# Patient Record
Sex: Male | Born: 1995
Health system: Southern US, Community
[De-identification: ages and names within clinical notes are randomized; demographics above are authoritative.]

## PROBLEM LIST (undated history)

## (undated) DIAGNOSIS — F329 Major depressive disorder, single episode, unspecified: Secondary | ICD-10-CM

## (undated) DIAGNOSIS — F419 Anxiety disorder, unspecified: Secondary | ICD-10-CM

## (undated) DIAGNOSIS — F32A Depression, unspecified: Secondary | ICD-10-CM

## (undated) HISTORY — DX: Anxiety disorder, unspecified: F41.9

## (undated) HISTORY — PX: APPENDECTOMY: SHX54

## (undated) HISTORY — PX: MYRINGOTOMY WITH TUBE PLACEMENT: SHX5663

## (undated) HISTORY — PX: HERNIA REPAIR: SHX51

---
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
## 2016-03-01 DIAGNOSIS — R1013 Epigastric pain: Secondary | ICD-10-CM | POA: Diagnosis not present

## 2016-03-01 DIAGNOSIS — R112 Nausea with vomiting, unspecified: Secondary | ICD-10-CM | POA: Diagnosis not present

## 2016-03-01 DIAGNOSIS — R1031 Right lower quadrant pain: Secondary | ICD-10-CM | POA: Diagnosis not present

## 2016-03-01 DIAGNOSIS — K353 Acute appendicitis with localized peritonitis: Secondary | ICD-10-CM | POA: Diagnosis not present

## 2016-03-01 DIAGNOSIS — R072 Precordial pain: Secondary | ICD-10-CM | POA: Diagnosis not present

## 2016-03-01 DIAGNOSIS — K3580 Unspecified acute appendicitis: Secondary | ICD-10-CM | POA: Diagnosis not present

## 2016-03-01 DIAGNOSIS — I4510 Unspecified right bundle-branch block: Secondary | ICD-10-CM | POA: Diagnosis not present

## 2016-03-02 DIAGNOSIS — K3580 Unspecified acute appendicitis: Secondary | ICD-10-CM | POA: Diagnosis not present

## 2016-03-02 DIAGNOSIS — K37 Unspecified appendicitis: Secondary | ICD-10-CM | POA: Diagnosis not present

## 2016-03-02 DIAGNOSIS — K3589 Other acute appendicitis: Secondary | ICD-10-CM | POA: Diagnosis not present

## 2016-03-02 DIAGNOSIS — K353 Acute appendicitis with localized peritonitis: Secondary | ICD-10-CM | POA: Diagnosis not present

## 2017-08-13 DIAGNOSIS — F322 Major depressive disorder, single episode, severe without psychotic features: Secondary | ICD-10-CM | POA: Diagnosis not present

## 2017-08-13 DIAGNOSIS — F1290 Cannabis use, unspecified, uncomplicated: Secondary | ICD-10-CM | POA: Diagnosis not present

## 2017-08-13 DIAGNOSIS — F411 Generalized anxiety disorder: Secondary | ICD-10-CM | POA: Diagnosis not present

## 2017-09-15 DIAGNOSIS — F322 Major depressive disorder, single episode, severe without psychotic features: Secondary | ICD-10-CM | POA: Diagnosis not present

## 2017-10-13 DIAGNOSIS — F322 Major depressive disorder, single episode, severe without psychotic features: Secondary | ICD-10-CM | POA: Diagnosis not present

## 2017-10-20 DIAGNOSIS — F319 Bipolar disorder, unspecified: Secondary | ICD-10-CM | POA: Diagnosis not present

## 2017-10-29 DIAGNOSIS — F319 Bipolar disorder, unspecified: Secondary | ICD-10-CM | POA: Diagnosis not present

## 2017-11-04 DIAGNOSIS — F319 Bipolar disorder, unspecified: Secondary | ICD-10-CM | POA: Diagnosis not present

## 2017-11-11 DIAGNOSIS — F319 Bipolar disorder, unspecified: Secondary | ICD-10-CM | POA: Diagnosis not present

## 2017-11-17 DIAGNOSIS — F319 Bipolar disorder, unspecified: Secondary | ICD-10-CM | POA: Diagnosis not present

## 2017-11-24 DIAGNOSIS — F319 Bipolar disorder, unspecified: Secondary | ICD-10-CM | POA: Diagnosis not present

## 2017-11-25 DIAGNOSIS — F319 Bipolar disorder, unspecified: Secondary | ICD-10-CM | POA: Diagnosis not present

## 2017-12-08 DIAGNOSIS — F319 Bipolar disorder, unspecified: Secondary | ICD-10-CM | POA: Diagnosis not present

## 2017-12-29 DIAGNOSIS — Z79899 Other long term (current) drug therapy: Secondary | ICD-10-CM | POA: Diagnosis not present

## 2017-12-29 DIAGNOSIS — F319 Bipolar disorder, unspecified: Secondary | ICD-10-CM | POA: Diagnosis not present

## 2018-02-04 DIAGNOSIS — F319 Bipolar disorder, unspecified: Secondary | ICD-10-CM | POA: Diagnosis not present

## 2018-02-21 DIAGNOSIS — F319 Bipolar disorder, unspecified: Secondary | ICD-10-CM | POA: Diagnosis not present

## 2018-03-05 DIAGNOSIS — F909 Attention-deficit hyperactivity disorder, unspecified type: Secondary | ICD-10-CM | POA: Diagnosis not present

## 2018-03-05 DIAGNOSIS — F329 Major depressive disorder, single episode, unspecified: Secondary | ICD-10-CM | POA: Diagnosis not present

## 2018-03-05 DIAGNOSIS — R110 Nausea: Secondary | ICD-10-CM | POA: Diagnosis not present

## 2018-03-05 DIAGNOSIS — R42 Dizziness and giddiness: Secondary | ICD-10-CM | POA: Diagnosis not present

## 2018-03-05 DIAGNOSIS — Z791 Long term (current) use of non-steroidal anti-inflammatories (NSAID): Secondary | ICD-10-CM | POA: Diagnosis not present

## 2018-03-05 DIAGNOSIS — Z79891 Long term (current) use of opiate analgesic: Secondary | ICD-10-CM | POA: Diagnosis not present

## 2018-03-05 DIAGNOSIS — T56891A Toxic effect of other metals, accidental (unintentional), initial encounter: Secondary | ICD-10-CM | POA: Diagnosis not present

## 2018-03-05 DIAGNOSIS — Z79899 Other long term (current) drug therapy: Secondary | ICD-10-CM | POA: Diagnosis not present

## 2018-03-20 ENCOUNTER — Encounter (HOSPITAL_COMMUNITY): Payer: Self-pay | Admitting: *Deleted

## 2018-03-20 ENCOUNTER — Inpatient Hospital Stay (HOSPITAL_COMMUNITY)
Admission: RE | Admit: 2018-03-20 | Discharge: 2018-03-24 | DRG: 885 | Disposition: A | Discharge status: Home / Self Care | Payer: BLUE CROSS/BLUE SHIELD | Attending: Psychiatry | Admitting: Psychiatry

## 2018-03-20 ENCOUNTER — Other Ambulatory Visit: Payer: Self-pay

## 2018-03-20 DIAGNOSIS — Z915 Personal history of self-harm: Secondary | ICD-10-CM | POA: Diagnosis not present

## 2018-03-20 DIAGNOSIS — Z8249 Family history of ischemic heart disease and other diseases of the circulatory system: Secondary | ICD-10-CM | POA: Diagnosis not present

## 2018-03-20 DIAGNOSIS — E785 Hyperlipidemia, unspecified: Secondary | ICD-10-CM | POA: Diagnosis not present

## 2018-03-20 DIAGNOSIS — R45851 Suicidal ideations: Secondary | ICD-10-CM | POA: Diagnosis present

## 2018-03-20 DIAGNOSIS — F3130 Bipolar disorder, current episode depressed, mild or moderate severity, unspecified: Principal | ICD-10-CM | POA: Diagnosis present

## 2018-03-20 DIAGNOSIS — G4700 Insomnia, unspecified: Secondary | ICD-10-CM | POA: Diagnosis not present

## 2018-03-20 DIAGNOSIS — Z818 Family history of other mental and behavioral disorders: Secondary | ICD-10-CM

## 2018-03-20 HISTORY — DX: Major depressive disorder, single episode, unspecified: F32.9

## 2018-03-20 HISTORY — DX: Depression, unspecified: F32.A

## 2018-03-20 MED ORDER — ACETAMINOPHEN 325 MG PO TABS: 650.0000 mg | ORAL_TABLET | Freq: Four times a day (QID) | ORAL | Status: DC | PRN

## 2018-03-20 MED ORDER — HYDROXYZINE HCL 25 MG PO TABS
25.0000 mg | ORAL_TABLET | Freq: Three times a day (TID) | ORAL | Status: DC | PRN
  Administered 2018-03-20: 25 mg via ORAL
  Filled 2018-03-20 (×2): qty 1

## 2018-03-20 MED ORDER — MAGNESIUM HYDROXIDE 400 MG/5ML PO SUSP: 30.0000 mL | Freq: Every day | ORAL | Status: DC | PRN

## 2018-03-20 MED ORDER — ALUM & MAG HYDROXIDE-SIMETH 200-200-20 MG/5ML PO SUSP: 30.0000 mL | ORAL | Status: DC | PRN

## 2018-03-20 MED ORDER — QUETIAPINE FUMARATE 50 MG PO TABS
50.0000 mg | ORAL_TABLET | Freq: Every day | ORAL | Status: DC
  Administered 2018-03-20: 50 mg via ORAL
  Filled 2018-03-20 (×3): qty 1

## 2018-03-20 MED ORDER — TRAZODONE HCL 50 MG PO TABS
50.0000 mg | ORAL_TABLET | Freq: Every evening | ORAL | Status: DC | PRN
  Administered 2018-03-21 – 2018-03-23 (×3): 50 mg via ORAL
  Filled 2018-03-20 (×3): qty 1

## 2018-03-20 NOTE — H&P (Signed)
Behavioral Health Medical Screening Exam  Ronald Stanley is an 22 y.o. male.  Total Time spent with patient: 20 minutes  Psychiatric Specialty Exam: Physical Exam  Nursing note and vitals reviewed. Constitutional: He is oriented to person, place, and time. He appears well-developed and well-nourished.  Cardiovascular: Normal rate.  Respiratory: Effort normal.  Musculoskeletal: Normal range of motion.  Neurological: He is alert and oriented to person, place, and time.  Skin: Skin is warm.    Review of Systems  Constitutional: Negative.   HENT: Negative.   Eyes: Negative.   Respiratory: Negative.   Cardiovascular: Negative.   Gastrointestinal: Negative.   Genitourinary: Negative.   Musculoskeletal: Negative.   Skin: Negative.   Neurological: Negative.   Endo/Heme/Allergies: Negative.   Psychiatric/Behavioral: Positive for depression, substance abuse and suicidal ideas. The patient is nervous/anxious and has insomnia.     Blood pressure 119/83, pulse 76, temperature 98.4 F (36.9 C), SpO2 100 %.There is no height or weight on file to calculate BMI.  General Appearance: Casual  Eye Contact:  Good  Speech:  Clear and Coherent and Normal Rate  Volume:  Normal  Mood:  Depressed  Affect:  Depressed  Thought Process:  Linear and Descriptions of Associations: Intact  Orientation:  Full (Time, Place, and Person)  Thought Content:  WDL  Suicidal Thoughts:  Yes.  with intent/plan  Homicidal Thoughts:  No  Memory:  Immediate;   Good Recent;   Good Remote;   Good  Judgement:  Fair  Insight:  Fair  Psychomotor Activity:  Normal  Concentration: Concentration: Good and Attention Span: Good  Recall:  Good  Fund of Knowledge:Good  Language: Good  Akathisia:  No  Handed:  Right  AIMS (if indicated):     Assets:  Communication Skills Desire for Improvement Financial Resources/Insurance Housing Physical Health Social Support Transportation  Sleep:        Musculoskeletal: Strength & Muscle Tone: within normal limits Gait & Station: normal Patient leans: N/A  Blood pressure 119/83, pulse 76, temperature 98.4 F (36.9 C), SpO2 100 %.  Recommendations:  Based on my evaluation the patient does not appear to have an emergency medical condition.  [name], FNP 03/20/2018, 6:22 PM

## 2018-03-20 NOTE — Progress Notes (Signed)
Patient was seen by me today and wanted to note that patient reported home meds of being Seroquel XR 800 mg nightly, Risperdal 1 mg p.o. nightly, lithium 600 mg p.o. nightly and Lexapro 10 mg nightly.  Patient and patient's parents reported that patient stopped his medications several days ago.  Patient did agree to restart the Seroquel at 50 mg p.o. nightly tonight to assist with sleep so that psychiatry can reevaluate his medications tomorrow.  Patient reported that he had reported to an emergency department due to suspected lithium toxicity and he was taken 900 mg nightly and they decreased his lithium to 600 mg and since then he has not felt well and has caused some worsening of symptoms.  Patient now states that after the way he felt on the lithium that he would prefer not to be on it at all anymore.

## 2018-03-20 NOTE — Tx Team (Signed)
Initial Treatment Plan 03/20/2018 7:23 PM Arlice Colt HAF:[id number]    PATIENT STRESSORS: Medication change or noncompliance   PATIENT STRENGTHS: Ability for insight Average or above average intelligence Capable of independent living General fund of knowledge Motivation for treatment/growth Physical Health Supportive family/friends   PATIENT IDENTIFIED PROBLEMS: "work on my coping skills for SI"  "have a more positive outlook on life"  Suicide Risk  Ineffective Coping               DISCHARGE CRITERIA:  Ability to meet basic life and health needs Adequate post-discharge living arrangements Improved stabilization in mood, thinking, and/or behavior Medical problems require only outpatient monitoring Motivation to continue treatment in a less acute level of care Need for constant or close observation no longer present Reduction of life-threatening or endangering symptoms to within safe limits Safe-care adequate arrangements made Verbal commitment to aftercare and medication compliance  PRELIMINARY DISCHARGE PLAN: Outpatient therapy  PATIENT/FAMILY INVOLVEMENT: This treatment plan has been presented to and reviewed with the patient, Deke Rench.  The patient and family have been given the opportunity to ask questions and make suggestions.  Ferrel Logan, RN 03/20/2018, 7:23 PM

## 2018-03-20 NOTE — BH Assessment (Signed)
Assessment Note  Ronald Stanley is an 22 y.o. male that presented to [facility] as a walkin. Pts parents brought him in after pt came to their house crying and reporting that he was having thoughts of hurting himself. Pts parents reported that they were worried about the pt and were not sure that they could keep him safe. Pts parents reported that two weeks ago pt was admitted to the hospital due to Lithium Toxicity, and once discharged he was told to only take 600 mg of Lithium instead of 900 mg. Pts parents reported that due to reaction to the medication the patient stopped taking all medication. They reported that pt was not sleeping, not going to work, not taking a bath, and not eating.   This Clinical research associate then assessed patient, he reported that he was diagnosed with Bipolar 6 months ago, and that in the beginning his medication was working but now he does not feel like it was helping. Pt reported that this last week as been bad for him and that his racing thoughts got worse. Pt reported that he has only been sleeping one hour a night, and that he started smoking THC to get some sleep. Pt reported that he would take a couple pulls a week of a blunt, but it was not helping. Pt reported that he was having SI, and could not contract for safety. Pt also reported that he had been cutting, cuts noted to right forearm. Pt reported that he would like inpt treatment as he could not live like this anymore.   Pt was seen by Reola Calkins NP, inpt treatment recommended.    Diagnosis: Severe Bipolar Disorder, recent episode depressed F31.4  Past Medical History:  Past Medical History:  Diagnosis Date  . Depression     Past Surgical History:  Procedure Laterality Date  . HERNIA REPAIR    . MYRINGOTOMY WITH TUBE PLACEMENT      Family History:  Family History  Problem Relation Age of Onset  . Hyperlipidemia Father   . Hypertension Father   . Depression Mother     Social History:  reports that he has never smoked.  He has never used smokeless tobacco. He reports that he has current or past drug history. Drug: Marijuana. Frequency: 1.00 time per week. He reports that he does not drink alcohol.  Additional Social History:  Alcohol / Drug Use Pain Medications: none Prescriptions: none Over the Counter: none History of alcohol / drug use?: Yes  CIWA: CIWA-Ar BP: 119/83 Pulse Rate: 76 COWS:    Allergies: No Known Allergies  Home Medications:  No medications prior to admission.    OB/GYN Status:  No LMP for male patient.  General Assessment Data Location of Assessment: [facility] Is this an Initial Assessment or a Re-assessment for this encounter?: Initial Assessment Patient Accompanied by:: Parent(mother and father) Language Other than English: No Living Arrangements: Other (Comment)(has 2 roommates) What gender do you identify as?: Male Marital status: Single Pregnancy Status: No Living Arrangements: Non-relatives/Friends Can pt return to current living arrangement?: Yes Admission Status: Voluntary Is patient capable of signing voluntary admission?: Yes Referral Source: Self/Family/Friend Insurance type: BCBC  Medical Screening Exam [facility] Walk-in ONLY) Medical Exam completed: Yes  Crisis Care Plan Living Arrangements: Non-relatives/Friends Legal Guardian: (none) Name of Psychiatrist: Garnette Gunner Name of Therapist: None  Education Status Is patient currently in school?: No Is the patient employed, unemployed or receiving disability?: Employed  Risk to self with the past 6 months Suicidal  Ideation: Yes-Currently Present Has patient been a risk to self within the past 6 months prior to admission? : Yes Suicidal Intent: Yes-Currently Present Has patient had any suicidal intent within the past 6 months prior to admission? : Yes Is patient at risk for suicide?: Yes Suicidal Plan?: Yes-Currently Present Has patient had any suicidal plan within the past 6 months  prior to admission? : Yes Specify Current Suicidal Plan: cut self Access to Means: Yes Specify Access to Suicidal Means: cut self What has been your use of drugs/alcohol within the last 12 months?: THC Previous Attempts/Gestures: Yes How many times?: 1 Other Self Harm Risks: depression  Triggers for Past Attempts: Unpredictable Intentional Self Injurious Behavior: Cutting Comment - Self Injurious Behavior: cuts to right arm Family Suicide History: No Recent stressful life event(s): Recent negative physical changes(unable to sleep, racing thoughts) Persecutory voices/beliefs?: No Depression: Yes Depression Symptoms: Insomnia, Tearfulness, Isolating, Fatigue, Loss of interest in usual pleasures, Feeling worthless/self pity Substance abuse history and/or treatment for substance abuse?: No Suicide prevention information given to non-admitted patients: Not applicable  Risk to Others within the past 6 months Homicidal Ideation: No Does patient have any lifetime risk of violence toward others beyond the six months prior to admission? : No Thoughts of Harm to Others: No Current Homicidal Intent: No Current Homicidal Plan: No Access to Homicidal Means: No History of harm to others?: No Assessment of Violence: None Noted Violent Behavior Description: none Does patient have access to weapons?: No Criminal Charges Pending?: No Does patient have a court date: No Is patient on probation?: No  Psychosis Hallucinations: None noted Delusions: None noted  Mental Status Report Appearance/Hygiene: Unremarkable Eye Contact: Good Motor Activity: Restlessness Speech: Logical/coherent Level of Consciousness: Alert Mood: Depressed Affect: Depressed Anxiety Level: Moderate Thought Processes: Coherent Judgement: Impaired Orientation: Person, Place, Time, Situation Obsessive Compulsive Thoughts/Behaviors: None  Cognitive Functioning Concentration: Decreased Memory: Recent Intact, Remote  Intact Is patient IDD: No Insight: Fair Impulse Control: Poor Appetite: Poor Have you had any weight changes? : Loss Amount of the weight change? (lbs): 15 lbs Sleep: Decreased Total Hours of Sleep: 1 Vegetative Symptoms: Staying in bed, Not bathing, Decreased grooming  ADLScreening [facility] Assessment Services) Patient's cognitive ability adequate to safely complete daily activities?: Yes Patient able to express need for assistance with ADLs?: Yes Independently performs ADLs?: Yes (appropriate for developmental age)  Prior Inpatient Therapy Prior Inpatient Therapy: No  Prior Outpatient Therapy Prior Outpatient Therapy: Yes Prior Therapy Dates: 2019 Prior Therapy Facilty/Provider(s): Garnette Gunner Reason for Treatment: Bipolar Does patient have an ACCT team?: No Does patient have Intensive In-House Services?  : No Does patient have [facility] services? : No Does patient have P4CC services?: No  ADL Screening (condition at time of admission) Patient's cognitive ability adequate to safely complete daily activities?: Yes Is the patient deaf or have difficulty hearing?: No Does the patient have difficulty seeing, even when wearing glasses/contacts?: No Does the patient have difficulty concentrating, remembering, or making decisions?: No Patient able to express need for assistance with ADLs?: Yes Does the patient have difficulty dressing or bathing?: No Independently performs ADLs?: Yes (appropriate for developmental age) Does the patient have difficulty walking or climbing stairs?: No Weakness of Legs: None Weakness of Arms/Hands: None  Home Assistive Devices/Equipment Home Assistive Devices/Equipment: None  Therapy Consults (therapy consults require a physician order) PT Evaluation Needed: No OT Evalulation Needed: No SLP Evaluation Needed: No Abuse/Neglect Assessment (Assessment to be complete while patient is alone) Abuse/Neglect Assessment Can  Be Completed: Yes Physical  Abuse: Denies Verbal Abuse: Denies Sexual Abuse: Denies Exploitation of patient/patient's resources: Denies Self-Neglect: Denies Values / Beliefs Cultural Requests During Hospitalization: None Spiritual Requests During Hospitalization: None Consults Spiritual Care Consult Needed: No Social Work Consult Needed: No Merchant navy officer (For Healthcare) Does Patient Have a Medical Advance Directive?: No Would patient like information on creating a medical advance directive?: No - Patient declined Nutrition Screen- [facility] Adult/WL/AP Has the patient recently lost weight without trying?: No Has the patient been eating poorly because of a decreased appetite?: No Malnutrition Screening Tool Score: 0        Disposition: Pt seen by Reola Calkins NP, recommendation was inpt treatment. Pt was assigned [phone]. Disposition Initial Assessment Completed for this Encounter: Yes Disposition of Patient: Admit Type of inpatient treatment program: Adult Patient refused recommended treatment: No Mode of transportation if patient is discharged?: N/A  On Site Evaluation by: Reola Calkins NP  Reviewed with Physician:    Buford Dresser 03/20/2018 6:47 PM

## 2018-03-20 NOTE — Progress Notes (Signed)
Writer spoke with patient 1:1 and he reports that he has been having issues sleeping. Writer informed him of medications available. He was encouraged to attend wrap up group and other groups during the day. He was also encouraged to be up in the dayroom as much as possible instead of isolating in his room. He did later go to the dayroom and watched tv. He requested medication to aid with sleep which he received. Support given and safety maintained on unit with 15 min checks.

## 2018-03-20 NOTE — Progress Notes (Signed)
Patient ID: Ronald Stanley, male   DOB: 02/18/1996, 22 y.o.   MRN: [id number]  Admission Note  D) Patient admitted to the adult unit 400 hall. Patient is a 22 year old male who presents voluntarily to [facility] as a walk-in with his parents. Patient was calm and cooperative with the admission process. Patient reports he does not feel his medications are effective and has been experiencing thoughts of SI with no plan. Patient reports poor appetite, irritability and says he is only sleeping 4 hours a night. Patient with history of bipolar disorder and lithium toxicity. Patient reports he lives in [place] with friends and is not in school this semester. Patient is employed at [employer] While here, patient wants to work on "coping with my SI" and "having a more positive outlook on life".  A) Skin assessment was completed and unremarkable except for SF cuts to his L upper forearm and a tattoo to his R forearm. Patient has no belongings on admission. Plan of care, unit policies and patient expectations were explained. Patient receptive to information given with no questions. Patient verbalized understanding and contracted for safety on the unit. Written consents obtained. Vital signs obtained and WNL. Meal tray provided. Patient oriented to the unit and their room. Patient placed on standard q15 safety checks. Low fall risk precautions initiated and reviewed with patient; patient verbalized understanding.   R) Patient is in no acute distress. Patient remains safe on the unit at this time. Patient without questions or concerns at this time. Patient is visiting with his parents on the unit. Will continue to monitor.

## 2018-03-21 DIAGNOSIS — Z915 Personal history of self-harm: Secondary | ICD-10-CM

## 2018-03-21 DIAGNOSIS — F3130 Bipolar disorder, current episode depressed, mild or moderate severity, unspecified: Secondary | ICD-10-CM | POA: Diagnosis present

## 2018-03-21 LAB — RAPID URINE DRUG SCREEN, HOSP PERFORMED
Amphetamines: NOT DETECTED
Barbiturates: NOT DETECTED
Benzodiazepines: NOT DETECTED
Cocaine: NOT DETECTED
Opiates: NOT DETECTED
TETRAHYDROCANNABINOL: POSITIVE — AB

## 2018-03-21 LAB — HEMOGLOBIN A1C
Hgb A1c MFr Bld: 4.7 % — ABNORMAL LOW (ref 4.8–5.6)
Mean Plasma Glucose: 88.19 mg/dL

## 2018-03-21 LAB — COMPREHENSIVE METABOLIC PANEL
ALBUMIN: 4.6 g/dL (ref 3.5–5.0)
ALK PHOS: 59 U/L (ref 38–126)
ALT: 14 U/L (ref 0–44)
ANION GAP: 8 (ref 5–15)
AST: 14 U/L — ABNORMAL LOW (ref 15–41)
BUN: 10 mg/dL (ref 6–20)
CO2: 30 mmol/L (ref 22–32)
Calcium: 9.8 mg/dL (ref 8.9–10.3)
Chloride: 106 mmol/L (ref 98–111)
Creatinine, Ser: 1.05 mg/dL (ref 0.61–1.24)
GFR calc Af Amer: >60 mL/min (ref >60)
GFR calc non Af Amer: >60 mL/min (ref >60)
GLUCOSE: 83 mg/dL (ref 70–99)
POTASSIUM: 4.1 mmol/L (ref 3.5–5.1)
SODIUM: 144 mmol/L (ref 135–145)
Total Bilirubin: 0.9 mg/dL (ref 0.3–1.2)
Total Protein: 7.4 g/dL (ref 6.5–8.1)

## 2018-03-21 LAB — LITHIUM LEVEL

## 2018-03-21 LAB — LIPID PANEL
CHOL/HDL RATIO: 3.4 ratio
CHOLESTEROL: 146 mg/dL (ref 0–200)
HDL: 43 mg/dL (ref >40)
LDL CALC: 91 mg/dL (ref 0–99)
Triglycerides: 60 mg/dL (ref <150)
VLDL: 12 mg/dL (ref 0–40)

## 2018-03-21 LAB — CBC
HEMATOCRIT: 47.6 % (ref 39.0–52.0)
Hemoglobin: 16.3 g/dL (ref 13.0–17.0)
MCH: 29.3 pg (ref 26.0–34.0)
MCHC: 34.2 g/dL (ref 30.0–36.0)
MCV: 85.5 fL (ref 78.0–100.0)
Platelets: 271 10*3/uL (ref 150–400)
RBC: 5.57 MIL/uL (ref 4.22–5.81)
RDW: 12.3 % (ref 11.5–15.5)
WBC: 5.6 10*3/uL (ref 4.0–10.5)

## 2018-03-21 LAB — TSH: TSH: 2.903 u[IU]/mL (ref 0.350–4.500)

## 2018-03-21 MED ORDER — QUETIAPINE FUMARATE 200 MG PO TABS
200.0000 mg | ORAL_TABLET | Freq: Every day | ORAL | Status: DC
  Administered 2018-03-21 – 2018-03-22 (×2): 200 mg via ORAL
  Filled 2018-03-21 (×3): qty 1

## 2018-03-21 MED ORDER — DIVALPROEX SODIUM ER 250 MG PO TB24
250.0000 mg | ORAL_TABLET | Freq: Once | ORAL | Status: AC
  Administered 2018-03-21: 250 mg via ORAL
  Filled 2018-03-21: qty 1

## 2018-03-21 MED ORDER — DIVALPROEX SODIUM ER 500 MG PO TB24
500.0000 mg | ORAL_TABLET | Freq: Every evening | ORAL | Status: DC
  Administered 2018-03-21 – 2018-03-23 (×3): 500 mg via ORAL
  Filled 2018-03-21 (×5): qty 1

## 2018-03-21 NOTE — [facility] Suicide Risk Assessment (Signed)
[facility] Admission Suicide Risk Assessment   Nursing information obtained from:  Patient, Review of record, Family Demographic factors:  Male, Caucasian, Adolescent or young adult Current Mental Status:  Suicidal ideation indicated by patient Loss Factors:  NA Historical Factors:  NA Risk Reduction Factors:  Positive social support, Sense of responsibility to family, Living with another person, especially a relative, Employed  Total Time spent with patient: 30 minutes Principal Problem: <principal problem not specified> Diagnosis:  There are no active problems to display for this patient.  Subjective Data: Patient is seen and examined.  Patient is a 22 year old male with a reported past psychiatric history significant for bipolar disorder who presented to the behavioral health hospital on [facility]/1 as a walk-in.  The patient's parents brought him in after the patient came to their house crying, and reporting he was having thoughts of hurting himself.  The patient's parents reported that they were worried about the patient and were not sure that they can keep him safe.  The patient's parents reported that 2 weeks ago he had been admitted to the hospital due to lithium toxicity.  After discharge he was told to take 600 mg of lithium nightly instead of 900 mg.  Since he had been off that medication he was not sleeping, not going to work, and not bathing and was not eating.  The patient had been diagnosed with bipolar disorder approximately 6 months prior to this.  In high school he had had depressive episodes, but had not had any mania.  Within the last 12 months he had his first manic episode where he would talk extensively, be hyperverbal, not sleep, and have increased goal-directed activity.  He also reported euphoric episodes.  The patient also admitted to smoking marijuana to sleep.  The patient did also admit to some cutting behaviors, and there were cuts on his right forearm.  He denied suicidal ideation to.   He was admitted to the hospital for evaluation and stabilization.  Continued Clinical Symptoms:  Alcohol Use Disorder Identification Test Final Score (AUDIT): 0 The "Alcohol Use Disorders Identification Test", Guidelines for Use in Primary Care, Second Edition.  World Science writer [facility]). Score between 0-7:  no or low risk or alcohol related problems. Score between 8-15:  moderate risk of alcohol related problems. Score between 16-19:  high risk of alcohol related problems. Score 20 or above:  warrants further diagnostic evaluation for alcohol dependence and treatment.   CLINICAL FACTORS:   Bipolar Disorder:   Mixed State   Musculoskeletal: Strength & Muscle Tone: within normal limits Gait & Station: normal Patient leans: N/A  Psychiatric Specialty Exam: Physical Exam  Nursing note and vitals reviewed. Constitutional: He is oriented to person, place, and time. He appears well-developed and well-nourished.  HENT:  Head: Atraumatic.  Respiratory: Effort normal.  Neurological: He is alert and oriented to person, place, and time.    ROS  Blood pressure 131/90, pulse 80, temperature 99 F (37.2 C), temperature source Oral, resp. rate 18, height 6' (1.829 m), weight 63.5 kg, SpO2 100 %.Body mass index is 18.99 kg/m.  General Appearance: Casual  Eye Contact:  Fair  Speech:  Slow  Volume:  Decreased  Mood:  Anxious and Depressed  Affect:  Congruent  Thought Process:  Coherent and Descriptions of Associations: Intact  Orientation:  Full (Time, Place, and Person)  Thought Content:  Logical  Suicidal Thoughts:  No  Homicidal Thoughts:  No  Memory:  Immediate;   Fair Recent;  Fair Remote;   Fair  Judgement:  Intact  Insight:  Fair  Psychomotor Activity:  Decreased and Psychomotor Retardation  Concentration:  Concentration: Fair and Attention Span: Fair  Recall:  [employer] of Knowledge:  Fair  Language:  Fair  Akathisia:  Negative  Handed:  Right  AIMS (if  indicated):     Assets:  Desire for Improvement Financial Resources/Insurance Housing Physical Health Resilience Social Support Talents/Skills  ADL's:  Intact  Cognition:  WNL  Sleep:  Number of Hours: 6.75      COGNITIVE FEATURES THAT CONTRIBUTE TO RISK:  None    SUICIDE RISK:   Moderate:  Frequent suicidal ideation with limited intensity, and duration, some specificity in terms of plans, no associated intent, good self-control, limited dysphoria/symptomatology, some risk factors present, and identifiable protective factors, including available and accessible social support.  PLAN OF CARE: Patient is seen and examined.  Patient is a 22 year old male with a past psychiatric history significant for bipolar disorder, most recently mixed, without psychotic features, severe.  He is admitted to the hospital for evaluation and stabilization.  He will be placed on 15-minute checks.  His lithium level in the emergency room is essentially negative, so that appears not to be a problem at this point.  His CBC, liver function enzymes and chemistries are all essentially normal.  Thyroid was normal.  He will be integrated into the milieu.  He will be seen by social work.  We discussed medication options today.  He is in agreement with starting Depakote.  I will give him Depakote ER 250 mg p.o. now x1, and start Depakote ER 500 mg p.o. every afternoon tonight.  Additionally he had been on Risperdal as well as Seroquel, and he prefer Seroquel.  He stated he had been taking up to 800 mg of Seroquel at bedtime.  I am going to restart his Seroquel at 300 mg p.o. nightly.  I reduce the dose because of the addition of the Depakote.  He has been treated with Lexapro for his depression, but because of the possibility of flipping him into a manic phase I will hold the Lexapro at this point.  We will monitor his response to the combination of the Depakote and Seroquel.  We will contact his family for collateral  information as well as his outpatient psychiatrist.  I certify that inpatient services furnished can reasonably be expected to improve the patient's condition.   Antonieta Pert, MD 03/21/2018, 10:48 AM

## 2018-03-21 NOTE — Progress Notes (Signed)
Recreation Therapy Notes  Date: 03/21/18 Time: 0930 Location: 300 Hall Dayroom  Group Topic: Stress Management  Goal Area(s) Addresses:  Patient will verbalize importance of using healthy stress management.  Patient will identify positive emotions associated with healthy stress management.   Intervention: Stress Management  Activity :  Meditation.  LRT introduced the stress management technique of meditation.  Patients were to listen and follow along as the meditation played in order to relax.  Education:  Stress Management, Discharge Planning.   Education Outcome: Acknowledges edcuation/In group clarification offered/Needs additional education  Clinical Observations/Feedback: Pt did not attend group.     Kerington Hildebrant, LRT/CTRS         Loida Calamia A 03/21/2018 12:22 PM 

## 2018-03-21 NOTE — Tx Team (Signed)
Interdisciplinary Treatment and Diagnostic Plan Update  03/21/2018 Time of Session: 9:40am Tykwon Fera MRN: [id number]  Principal Diagnosis: <principal problem not specified>  Secondary Diagnoses: Active Problems:   * No active hospital problems. *   Current Medications:  Current Facility-Administered Medications  Medication Dose Route Frequency Provider Last Rate Last Dose  . acetaminophen (TYLENOL) tablet 650 mg  650 mg Oral Q6H PRN Money, Lowry Ram, FNP      . alum & mag hydroxide-simeth (MAALOX/MYLANTA) 200-200-20 MG/5ML suspension 30 mL  30 mL Oral Q4H PRN Money, Lowry Ram, FNP      . hydrOXYzine (ATARAX/VISTARIL) tablet 25 mg  25 mg Oral TID PRN Money, Lowry Ram, FNP   25 mg at 03/20/18 2134  . magnesium hydroxide (MILK OF MAGNESIA) suspension 30 mL  30 mL Oral Daily PRN Money, Lowry Ram, FNP      . QUEtiapine (SEROQUEL) tablet 50 mg  50 mg Oral QHS Money, Travis B, FNP   50 mg at 03/20/18 2134  . traZODone (DESYREL) tablet 50 mg  50 mg Oral QHS PRN Money, Lowry Ram, FNP       PTA Medications: No medications prior to admission.    Patient Stressors: Medication change or noncompliance  Patient Strengths: Ability for insight Average or above average intelligence Capable of independent living General fund of knowledge Motivation for treatment/growth Physical Health Supportive family/friends  Treatment Modalities: Medication Management, Group therapy, Case management,  1 to 1 session with clinician, Psychoeducation, Recreational therapy.   Physician Treatment Plan for Primary Diagnosis: <principal problem not specified> Long Term Goal(s):     Short Term Goals:    Medication Management: Evaluate patient's response, side effects, and tolerance of medication regimen.  Therapeutic Interventions: 1 to 1 sessions, Unit Group sessions and Medication administration.  Evaluation of Outcomes: Not Met  Physician Treatment Plan for Secondary Diagnosis: Active Problems:   * No  active hospital problems. *  Long Term Goal(s):     Short Term Goals:       Medication Management: Evaluate patient's response, side effects, and tolerance of medication regimen.  Therapeutic Interventions: 1 to 1 sessions, Unit Group sessions and Medication administration.  Evaluation of Outcomes: Not Met   RN Treatment Plan for Primary Diagnosis: <principal problem not specified> Long Term Goal(s): Knowledge of disease and therapeutic regimen to maintain health will improve  Short Term Goals: Ability to disclose and discuss suicidal ideas, Ability to identify and develop effective coping behaviors will improve and Compliance with prescribed medications will improve  Medication Management: RN will administer medications as ordered by provider, will assess and evaluate patient's response and provide education to patient for prescribed medication. RN will report any adverse and/or side effects to prescribing provider.  Therapeutic Interventions: 1 on 1 counseling sessions, Psychoeducation, Medication administration, Evaluate responses to treatment, Monitor vital signs and CBGs as ordered, Perform/monitor CIWA, COWS, AIMS and Fall Risk screenings as ordered, Perform wound care treatments as ordered.  Evaluation of Outcomes: Not Met   LCSW Treatment Plan for Primary Diagnosis: <principal problem not specified> Long Term Goal(s): Safe transition to appropriate next level of care at discharge, Engage patient in therapeutic group addressing interpersonal concerns.  Short Term Goals: Engage patient in aftercare planning with referrals and resources and Increase skills for wellness and recovery  Therapeutic Interventions: Assess for all discharge needs, 1 to 1 time with Social worker, Explore available resources and support systems, Assess for adequacy in community support network, Educate family and significant other(s) on suicide  prevention, Complete Psychosocial Assessment, Interpersonal  group therapy.  Evaluation of Outcomes: Not Met   Progress in Treatment: Attending groups: No. New to unit  Participating in groups: No. Taking medication as prescribed: Yes. Toleration medication: Yes. Family/Significant other contact made: No, will contact:  if patient consents Patient understands diagnosis: Yes. Discussing patient identified problems/goals with staff: Yes. Medical problems stabilized or resolved: Yes. Denies suicidal/homicidal ideation: Yes. Issues/concerns per patient self-inventory: No. Other:   New problem(s) identified: None  New Short Term/Long Term Goal(s):medication stabilization, elimination of SI thoughts, development of comprehensive mental wellness plan.   Patient Goals:  "Learn better ways to cope with my symptoms, especially my suicidal thoughts"  Discharge Plan or Barriers: CSW will assess for an appropriate discharge plan.   Reason for Continuation of Hospitalization: Depression Medication stabilization Suicidal ideation  Estimated Length of Stay:3-5 days.   Attendees: Patient: Ronald Stanley  03/21/2018 8:36 AM  Physician: Dr. Myles Lipps, MD 03/21/2018 8:36 AM  Nursing: Chrys Racer.Jacinto Reap, RN 03/21/2018 8:36 AM  RN Care Manager: Rhunette Croft 03/21/2018 8:36 AM  Social Worker: Radonna Ricker, [place] 03/21/2018 8:36 AM  Recreational Therapist: Rhunette Croft 03/21/2018 8:36 AM  Other: X 03/21/2018 8:36 AM  Other: X 03/21/2018 8:36 AM  Other:X 03/21/2018 8:36 AM    Scribe for Treatment Team: Marylee Floras, [place] 03/21/2018 8:36 AM

## 2018-03-21 NOTE — [facility] Counselor (Signed)
Adult Comprehensive Assessment  Patient ID: Ronald Stanley, male   DOB: [birth year], 22 y.o.   MRN: [id number]  Information Source: Information source: Patient  Current Stressors:  Patient states their primary concerns and needs for treatment are:: "suicidal thoughts , depression, lack of sleep and isolation"  Patient states their goals for this hospitilization and ongoing recovery are:: "learn better coping skills and cooperating with steady treatments"  Educational / Learning stressors: Patient denies any stressors; Patient reports he starts school at [employer] in Spring 2020.  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  Family Relationships: Patient denies any family stressors  Financial / Lack of resources (include bankruptcy): Patient denies any stressors Housing / Lack of housing: Patient reports he lives with two friends in [place].  Physical health (include injuries & life threatening diseases): Patient denies any stressors Social relationships: Patient denies any stressors  Substance abuse: Patient denies any susbtance use  Bereavement / Loss: Patient denies any stressors   Living/Environment/Situation:  Living Arrangements: Non-relatives/Friends Living conditions (as described by patient or guardian): "Good" Who else lives in the home?: Two roommates  How long has patient lived in current situation?: 1 1/2 years  What is atmosphere in current home: Comfortable, Paramedic, Supportive  Family History:  Marital status: Single Are you sexually active?: Yes What is your sexual orientation?: Heterosexual  Has your sexual activity been affected by drugs, alcohol, medication, or emotional stress?: No  Does patient have children?: No  Childhood History:  By whom was/is the patient raised?: Mother Additional childhood history information: Patient reports his parents divorced when he wa 88 years old.  Description of  patient's relationship with caregiver when they were a child: Patient reports having a good relationship with his parents during his childhood, until he became an adolescent. Patient reports he begun to distance himself around the age of 60.  Patient's description of current relationship with people who raised him/her: Patient reports his relationship with both parents has improvedover the last few months.  How were you disciplined when you got in trouble as a child/adolescent?: Spankings, Restrictions, Punishment Does patient have siblings?: Yes Number of Siblings: 3 Description of patient's current relationship with siblings: Patient reports he has a distant relationship with his three siblings currently.  Did patient suffer any verbal/emotional/physical/sexual abuse as a child?: No Did patient suffer from severe childhood neglect?: No Has patient ever been sexually abused/assaulted/raped as an adolescent or adult?: No Was the patient ever a victim of a crime or a disaster?: No Witnessed domestic violence?: No Has patient been effected by domestic violence as an adult?: No  Education:  Highest grade of school patient has completed: 12th grade; Some college  Currently a student?: No Learning disability?: No  Employment/Work Situation:   Employment situation: Employed Where is patient currently employed?: [employer]  How long has patient been employed?: 4 1/2 years  Patient's job has been impacted by current illness: Yes Describe how patient's job has been impacted: Patient reports he has called out of work due to struggling with his depressive symptoms.  What is the longest time patient has a held a job?: 4 1/2 years  Where was the patient employed at that time?: Current jobs Did You Receive Any Psychiatric Treatment/Services While in Equities trader?: No Are There Guns or Other Weapons in Your Home?: No  Financial Resources:   Financial resources: Income from employment, Private  insurance Does patient have a  representative payee or guardian?: No  Alcohol/Substance Abuse:   What has been your use of drugs/alcohol within the last 12 months?: Patient denies  If attempted suicide, did drugs/alcohol play a role in this?: No Alcohol/Substance Abuse Treatment Hx: Denies past history Has alcohol/substance abuse ever caused legal problems?: No  Social Support System:   Patient's Community Support System: Good Describe Community Support System: "My whole family" Type of faith/religion: Christianity  How does patient's faith help to cope with current illness?: Prayer  Leisure/Recreation:   Leisure and Hobbies: "I like to read and play video games"   Strengths/Needs:   What is the patient's perception of their strengths?: "I'm pretty dependable, I'm honest and I'm trustworthy" Patient states they can use these personal strengths during their treatment to contribute to their recovery: Yes  Patient states these barriers may affect/interfere with their treatment: No  Patient states these barriers may affect their return to the community: No  Other important information patient would like considered in planning for their treatment: No   Discharge Plan:   Currently receiving community mental health services: Yes (From Whom)(Dr. Special educational needs teacher [facility] ) Patient states concerns and preferences for aftercare planning are: Continue with current outpatient provider  Patient states they will know when they are safe and ready for discharge when: Yes  Does patient have access to transportation?: Yes Does patient have financial barriers related to discharge medications?: No Patient description of barriers related to discharge medications: None Will patient be returning to same living situation after discharge?: Yes  Summary/Recommendations:   Summary and Recommendations (to be completed by the evaluator): Ronald Stanley is a 22 year old male who is diagnosed with Severe Bipolar  Disorder, recent episode depressed. He presented to the hospital seeking treatment for suicidal ideation and depressive symptoms. Ronald Stanley was pleasant and cooperative with providing information. Ronald Stanley reports that he experienced an increase in suicidal thoughts after not getting adequate sleep. He also states that he has become isolative and does not know why. Ronald Stanley states that his job as a Production designer, theatre/television/film at [employer] also contributes to his depressive symtpoms. Ronald Stanley reports that he would like to learn better coping skills and have his medications adjusted to better manage his symptoms. Ronald Stanley follows up with Dr. Shawnee Knapp at [facility] for outpatient services. [facility] can benefit from crisis stabilization, medication management, therapeutic milieu and referral services.   Ronald Stanley. 03/21/2018

## 2018-03-21 NOTE — H&P (Signed)
Psychiatric Admission Assessment Adult  Patient Identification: Ronald Stanley MRN:  [id number] Date of Evaluation:  03/21/2018 Chief Complaint:  Bipolar disorder mre depressed without psychotic features Principal Diagnosis: Bipolar I disorder, most recent episode depressed (Ronald Stanley) Diagnosis:   Patient Active Problem List   Diagnosis Date Noted  . Bipolar I disorder, most recent episode depressed ([place]) [F31.30] 03/21/2018    Priority: High   History of Present Illness: Pt is a 22yo caucasian male with a history of Bipolar 1 disorder, mixed. He has known manic phases in the past and was treated on Lithium yet recently had Lithium toxicity and was admitted to the hospital 2 weeks ago. He was then titrated from 975m down to 6082m He was also taking Seroquel 80058mnd Risperidone 1mg54mily. Pt presented to the ED with complaints of not showering for many days at a time, losing motivation, and feeling very depressed overall. Pt would like to come off Lithium. Per Dr. ClarKarmen Stabses, pt also has a history of intermittent self-harm via cutting (right forearm, mildly visible).   Today, pt seen and chart reviewed. Pt is alert/oriented x4, calm, cooperative, and appropriate to situation. Pt denies suicidal/homicidal ideation and psychosis and does not appear to be responding to internal stimuli. Pt does report he felt fleeting suicidal thoughts when he was sleeping poorly the past week or so. He is able to contract for safety at this time and is future-oriented and goal-directed. Pt is in school for Engineering and presents as very intelligent with good insight. Pt and Dr. ClarMallie Dartingke earlier today and changes were made to the medication regimen, with which pt is in agreement. See plan below.   Associated Signs/Symptoms: Depression Symptoms:  depressed mood, fatigue, feelings of worthlessness/guilt, difficulty concentrating, hopelessness, (Hypo) Manic Symptoms:  Distractibility, Anxiety Symptoms:   Excessive Worry, Psychotic Symptoms:  denies PTSD Symptoms: NA Total Time spent with patient: 45 minutes  Past Psychiatric History: Bipolar, manic and depressed  Is the patient at risk to self? Yes, moderately Has the patient been a risk to self in the past 6 months? Yes Has the patient been a risk to self within the distant past? Yes.    Is the patient a risk to others? No.  Has the patient been a risk to others in the past 6 months? No.  Has the patient been a risk to others within the distant past? No.   Prior Inpatient Therapy: Prior Inpatient Therapy: No Prior Outpatient Therapy: Prior Outpatient Therapy: Yes Prior Therapy Dates: 2019 Prior Therapy Facilty/Provider(s): [facility] for Treatment: Bipolar Does patient have an ACCT team?: No Does patient have Intensive In-House Services?  : No Does patient have [facility] services? : No Does patient have P4CC services?: No  Alcohol Screening: 1. How often do you have a drink containing alcohol?: Never 2. How many drinks containing alcohol do you have on a typical day when you are drinking?: 1 or 2 3. How often do you have six or more drinks on one occasion?: Never AUDIT-C Score: 0 9. Have you or someone else been injured as a result of your drinking?: No 10. Has a relative or friend or a doctor or another health worker been concerned about your drinking or suggested you cut down?: No Alcohol Use Disorder Identification Test Final Score (AUDIT): 0 Intervention/Follow-up: AUDIT Score <7 follow-up not indicated Substance Abuse History in the last 12 months:  THC Consequences of Substance Abuse: mood lability with THC Previous Psychotropic Medications: Yes  Psychological Evaluations:  Yes  Past Medical History:  Past Medical History:  Diagnosis Date  . Depression     Past Surgical History:  Procedure Laterality Date  . HERNIA REPAIR    . MYRINGOTOMY WITH TUBE PLACEMENT     Family History:  Family History   Problem Relation Age of Onset  . Hyperlipidemia Father   . Hypertension Father   . Depression Mother    Family Psychiatric  History: depression Tobacco Screening: Have you used any form of tobacco in the last 30 days? (Cigarettes, Smokeless Tobacco, Cigars, and/or Pipes): No Social History:  Social History   Substance and Sexual Activity  Alcohol Use No     Social History   Substance and Sexual Activity  Drug Use Yes  . Frequency: 1.0 times per week  . Types: Marijuana   Comment: pt reported that she take a couple pulls a week to help him sleep    Additional Social History: Marital status: Single Are you sexually active?: Yes What is your sexual orientation?: Heterosexual  Has your sexual activity been affected by drugs, alcohol, medication, or emotional stress?: No  Does patient have children?: No    Pain Medications: none Prescriptions: none Over the Counter: none History of alcohol / drug use?: Yes                    Allergies:  No Known Allergies Lab Results:  Results for orders placed or performed during the hospital encounter of 03/20/18 (from the past 48 hour(s))  Urine rapid drug screen (hosp performed)not at [facility]     Status: Abnormal   Collection Time: 03/20/18  7:44 PM  Result Value Ref Range   Opiates NONE DETECTED NONE DETECTED   Cocaine NONE DETECTED NONE DETECTED   Benzodiazepines NONE DETECTED NONE DETECTED   Amphetamines NONE DETECTED NONE DETECTED   Tetrahydrocannabinol POSITIVE (A) NONE DETECTED   Barbiturates NONE DETECTED NONE DETECTED    Comment: (NOTE) DRUG SCREEN FOR MEDICAL PURPOSES ONLY.  IF CONFIRMATION IS NEEDED FOR ANY PURPOSE, NOTIFY LAB WITHIN 5 DAYS. LOWEST DETECTABLE LIMITS FOR URINE DRUG SCREEN Drug Class                     Cutoff (ng/mL) Amphetamine and metabolites    1000 Barbiturate and metabolites    200 Benzodiazepine                 637 Tricyclics and metabolites     300 Opiates and metabolites         300 Cocaine and metabolites        300 THC                            50 Performed at [facility], [place] [address] [place], [place] [address]   CBC     Status: None   Collection Time: 03/21/18  6:31 AM  Result Value Ref Range   WBC 5.6 4.0 - 10.5 K/uL   RBC 5.57 4.22 - 5.81 MIL/uL   Hemoglobin 16.3 13.0 - 17.0 g/dL   HCT 47.6 39.0 - 52.0 %   MCV 85.5 78.0 - 100.0 fL   MCH 29.3 26.0 - 34.0 pg   MCHC 34.2 30.0 - 36.0 g/dL   RDW 12.3 11.5 - 15.5 %   Platelets 271 150 - 400 K/uL    Comment: Performed at [facility], [address] [place], [place]  [address]  Comprehensive metabolic panel     Status: Abnormal   Collection Time: 03/21/18  6:31 AM  Result Value Ref Range   Sodium 144 135 - 145 mmol/L   Potassium 4.1 3.5 - 5.1 mmol/L   Chloride 106 98 - 111 mmol/L   CO2 30 22 - 32 mmol/L   Glucose, Bld 83 70 - 99 mg/dL   BUN 10 6 - 20 mg/dL   Creatinine, Ser 1.05 0.61 - 1.24 mg/dL   Calcium 9.8 8.9 - 10.3 mg/dL   Total Protein 7.4 6.5 - 8.1 g/dL   Albumin 4.6 3.5 - 5.0 g/dL   AST 14 (L) 15 - 41 U/L   ALT 14 0 - 44 U/L   Alkaline Phosphatase 59 38 - 126 U/L   Total Bilirubin 0.9 0.3 - 1.2 mg/dL   GFR calc non Af Amer >60 >60 mL/min   GFR calc Af Amer >60 >60 mL/min    Comment: (NOTE) The eGFR has been calculated using the CKD EPI equation. This calculation has not been validated in all clinical situations. eGFR's persistently <60 mL/min signify possible Chronic Kidney Disease.    Anion gap 8 5 - 15    Comment: Performed at [facility], [place] [address], [place], [place] [address]  Hemoglobin A1c     Status: Abnormal   Collection Time: 03/21/18  6:31 AM  Result Value Ref Range   Hgb A1c MFr Bld 4.7 (L) 4.8 - 5.6 %    Comment: (NOTE) Pre diabetes:          5.7%-6.4% Diabetes:              >6.4% Glycemic control for   <7.0% adults with diabetes    Mean Plasma Glucose 88.19 mg/dL    Comment: Performed at [facility] [address], [place], [place] [address]  Lipid panel     Status: None   Collection Time: 03/21/18  6:31 AM  Result Value Ref Range   Cholesterol 146 0 - 200 mg/dL   Triglycerides 60 <150 mg/dL   HDL 43 >40 mg/dL   Total CHOL/HDL Ratio 3.4 RATIO   VLDL 12 0 - 40 mg/dL   LDL Cholesterol 91 0 - 99 mg/dL    Comment:        Total Cholesterol/HDL:CHD Risk Coronary Heart Disease Risk Table                     Men   Women  1/2 Average Risk   3.4   3.3  Average Risk       5.0   4.4  2 X Average Risk   9.6   7.1  3 X Average Risk  23.4   11.0        Use the calculated Patient Ratio above and the CHD Risk Table to determine the patient's CHD Risk.        ATP III CLASSIFICATION (LDL):  <100     mg/dL   Optimal  100-129  mg/dL   Near or Above                    Optimal  130-159  mg/dL   Borderline  160-189  mg/dL   High  >190     mg/dL   Very High Performed at [place] [address], [place], [place] [address]   TSH     Status: None   Collection Time:  03/21/18  6:31 AM  Result Value Ref Range   TSH 2.903 0.350 - 4.500 uIU/mL    Comment: Performed by a 3rd Generation assay with a functional sensitivity of <=0.01 uIU/mL. Performed at [facility], [place] [address], [place], [place] [address]   Lithium level     Status: Abnormal   Collection Time: 03/21/18  6:31 AM  Result Value Ref Range   Lithium Lvl <0.06 (L) 0.60 - 1.20 mmol/L    Comment: REPEATED TO VERIFY Performed at [place] [facility] [address], [place], [place] [address]     Blood Alcohol level:  No results found for: [facility]  Metabolic Disorder Labs:  Lab Results  Component Value Date   HGBA1C 4.7 (L) 03/21/2018   MPG 88.19 03/21/2018   No results found for: PROLACTIN Lab Results  Component Value Date   CHOL 146 03/21/2018   TRIG 60 03/21/2018   HDL 43 03/21/2018   CHOLHDL 3.4 03/21/2018   VLDL 12 03/21/2018   LDLCALC 91 03/21/2018     Current Medications: Current Facility-Administered Medications  Medication Dose Route Frequency Provider Last Rate Last Dose  . acetaminophen (TYLENOL) tablet 650 mg  650 mg Oral Q6H PRN Money, Lowry Ram, FNP      . alum & mag hydroxide-simeth (MAALOX/MYLANTA) 200-200-20 MG/5ML suspension 30 mL  30 mL Oral Q4H PRN Money, Darnelle Maffucci B, FNP      . divalproex (DEPAKOTE ER) 24 hr tablet 250 mg  250 mg Oral Once Sharma Covert, MD      . divalproex (DEPAKOTE ER) 24 hr tablet 500 mg  500 mg Oral QPM Sharma Covert, MD      . hydrOXYzine (ATARAX/VISTARIL) tablet 25 mg  25 mg Oral TID PRN Money, Lowry Ram, FNP   25 mg at 03/20/18 2134  . magnesium hydroxide (MILK OF MAGNESIA) suspension 30 mL  30 mL Oral Daily PRN Money, Lowry Ram, FNP      . QUEtiapine (SEROQUEL) tablet 200 mg  200 mg Oral QHS Sharma Covert, MD      . traZODone (DESYREL) tablet 50 mg  50 mg Oral QHS PRN Money, Lowry Ram, FNP       PTA Medications: Medications Prior to Admission  Medication Sig Dispense Refill Last Dose  . escitalopram (LEXAPRO) 10 MG tablet Take 10 mg by mouth at bedtime.   Past Week at Unknown time  . lithium carbonate 300 MG capsule Take 600 mg by mouth at bedtime.   Past Week at Unknown time  . QUEtiapine (SEROQUEL XR) 400 MG 24 hr tablet Take 800 mg by mouth at bedtime.   Past Week at Unknown time  . risperiDONE (RISPERDAL) 1 MG tablet Take 1 mg by mouth at bedtime.   Past Week at Unknown time    Musculoskeletal: Strength & Muscle Tone: within normal limits Gait & Station: normal Patient leans: N/A  Psychiatric Specialty Exam: Physical Exam  Review of Systems  Psychiatric/Behavioral: Positive for depression and substance abuse. Negative for hallucinations and suicidal ideas. The patient is nervous/anxious and has insomnia.   All other systems reviewed and are negative.   Blood pressure 131/90, pulse 80, temperature 99 F (37.2 C), temperature source Oral, resp. rate 18, height 6' (1.829 m),  weight 63.5 kg, SpO2 100 %.Body mass index is 18.99 kg/m.  General Appearance: Casual and Fairly Groomed  Eye Contact:  Good  Speech:  Clear and Coherent and Normal Rate  Volume:  Normal  Mood:  Anxious  Affect:  Appropriate and Congruent  Thought Process:  Coherent, Goal Directed, Linear and Descriptions of Associations: Intact  Orientation:  Full (Time, Place, and Person)  Thought Content:  Focused on treatment plan   Suicidal Thoughts:  denies today but reports recently  Homicidal Thoughts:  No  Memory:  Immediate;   Fair Recent;   Fair Remote;   Fair  Judgement:  Fair  Insight:  Fair  Psychomotor Activity:  Normal  Concentration:  Concentration: Fair and Attention Span: Fair  Recall:  [employer] of Knowledge:  Fair  Language:  Fair  Akathisia:  No  Handed:    AIMS (if indicated):     Assets:  Communication Skills Desire for Improvement  ADL's:  Intact  Cognition:  WNL  Sleep:  Number of Hours: 6.75    Treatment Plan Summary: Bipolar I disorder, most recent episode depressed ([place]) unstable, managed as below:   Observation Level/Precautions:  15 minute checks  Laboratory:  Labs resulted, reviewed, and stable at this time.   Psychotherapy:  Group therapy, individual therapy, psychoeducation  Medications:  See MAR above  Consultations: None    Discharge Concerns: None    Estimated LOS: 5-7 days  Other:  N/A    Physician Treatment Plan for Primary Diagnosis: Bipolar I disorder, most recent episode depressed ([facility])   Pharmacologic: -Discontinue Risperidone -Discontinue Lithium -Start Depakote ER 570m PO QHS (2567mfront-load given) -Modify Seroquel to 20074mO QHS -Consider SSRI after some stabilization on other meds to avoid sparking a manic phase by giving too early -Trazodone 55m75m QHS PRN insomnia  Non-pharmacologic: Review of chart, vital signs, medications, and notes.  1-Group therapy  2-Medication management for depression and anxiety: Medications  reviewed with the patient and he stated no untoward effects. 3-Coping skills for depression, anxiety  4-Continue crisis stabilization and management  5-Address health issues--monitoring vital signs, stable  6-Treatment plan in progress to prevent relapse of depression and mania 7-Add 12-lead EKG as a baseline (none on file); other labs are unremarkable [facility]   Long Term Goal(s): Improvement in symptoms so as ready for discharge  Short Term Goals: Ability to verbalize feelings will improve, Ability to disclose and discuss suicidal ideas, Ability to demonstrate self-control will improve, Ability to identify and develop effective coping behaviors will improve, Ability to maintain clinical measurements within normal limits will improve and Compliance with prescribed medications will improve  Physician Treatment Plan for Secondary Diagnosis: Principal Problem:   Bipolar I disorder, most recent episode depressed ([facility])[place] Term Goal(s): Improvement in symptoms so as ready for discharge  Short Term Goals: Ability to identify changes in lifestyle to reduce recurrence of condition will improve, Ability to verbalize feelings will improve, Ability to disclose and discuss suicidal ideas, Ability to demonstrate self-control will improve, Ability to identify and develop effective coping behaviors will improve, Ability to maintain clinical measurements within normal limits will improve and Compliance with prescribed medications will improve  I certify that inpatient services furnished can reasonably be expected to improve the patient's condition.    WithBenjamine MolaP Malott/201911:39 AM

## 2018-03-21 NOTE — Progress Notes (Signed)
Pt is observed in the dayroom, attending wrap-up group. Pt had visitors this evening.Pt appears depressed in affect and mood; however; brightens on approach. Pt denies SI/HI/AVH/Pain at this time. No new c/o's. PRN trazodone requested and given. Support provided. Will continue with POC.

## 2018-03-21 NOTE — Progress Notes (Signed)
D: Patient is visible in the milieu.  He is quiet and somewhat withdrawn.  Patient has a history of cutting, however, contracts verbally on the unit.  He states, "I want help with my suicidal thoughts."  Patient rates his depression as a 7; hopelessness as a 4; anxiety as a 3.  He reports low energy and poor concentration.  He is pleasant with staff.  A: Continue to monitor medication management and MD orders.  Safety checks completed every 15 minutes per protocol.  Offer support and encouragement as needed.  R: Patient is receptive to staff; his behavior is appropriate.

## 2018-03-21 NOTE — [facility] Suicide Risk Assessment (Signed)
[facility] INPATIENT:  Family/Significant Other Suicide Prevention Education  Suicide Prevention Education:  Patient Refusal for Family/Significant Other Suicide Prevention Education: The patient Ronald Stanley has refused to provide written consent for family/significant other to be provided Family/Significant Other Suicide Prevention Education during admission and/or prior to discharge.  Physician notified.  SPE completed with patient, as patient refused to consent to family contact. SPI pamphlet provided to pt and pt was encouraged to share information with support network, ask questions, and talk about any concerns relating to SPE. Patient denies access to guns/firearms and verbalized understanding of information provided. Mobile Crisis information also provided to patient.    [name] 03/21/2018, 11:15 AM

## 2018-03-22 NOTE — Plan of Care (Signed)
  Problem: Activity: Goal: Interest or engagement in activities will improve Outcome: Progressing   Problem: Safety: Goal: Periods of time without injury will increase Outcome: Progressing  DAR NOTE: Patient presents with anxious affect and depressed mood.  Denies suicidal thoughts, pain, auditory and visual hallucinations.  Described energy level as normal and concentration as good.  Rates depression at 3, hopelessness at 2, and anxiety at 3.  Maintained on routine safety checks.  Medications given as prescribed.  Support and encouragement offered as needed.  Attended group and participated.  States goal for today is "doing what I need to do to go home."  Patient visible in milieu with minimal interactions.  Offered no complaint.

## 2018-03-22 NOTE — [facility] Group Notes (Signed)
LCSW Group Therapy Note 03/22/2018 11:04 AM  Type of Therapy and Topic: Group Therapy: Overcoming Obstacles  Participation Level: Active  Description of Group:  In this group patients will be encouraged to explore what they see as obstacles to their own wellness and recovery. They will be guided to discuss their thoughts, feelings, and behaviors related to these obstacles. The group will process together ways to cope with barriers, with attention given to specific choices patients can make. Each patient will be challenged to identify changes they are motivated to make in order to overcome their obstacles. This group will be process-oriented, with patients participating in exploration of their own experiences as well as giving and receiving support and challenge from other group members.  Therapeutic Goals: 1. Patient will identify personal and current obstacles as they relate to admission. 2. Patient will identify barriers that currently interfere with their wellness or overcoming obstacles.  3. Patient will identify feelings, thought process and behaviors related to these barriers. 4. Patient will identify two changes they are willing to make to overcome these obstacles:   Summary of Patient Progress  Ronald Stanley was engaged and participated throughout the group session. Ronald Stanley reports that his current obstacle is "not knowing enough coping skills". Ronald Stanley states that his lack of knowledge and resistance to treatment in the past has made it difficult for him to learn new coping skills. Ronald Stanley reports that "accepting that you need help sometimes" has helped him become more receptive to treatment.     Therapeutic Modalities:  Cognitive Behavioral Therapy Solution Focused Therapy Motivational Interviewing Relapse Prevention Therapy   Alcario Drought Clinical Social Worker

## 2018-03-22 NOTE — [facility] Group Notes (Signed)
Pt attended spiritual care group on grief and loss facilitated by chaplain Salvador Bigbee   Group opened with brief discussion and psycho-social ed around grief and loss in relationships and in relation to self.  Group identifyed life patterns, circumstances, changes that are connected to grief response. Established group norm of speaking from own life experience. Group goal of establishing open and affirming space for members to share loss and experience with grief, normalize grief experience and provide psycho social education and grief support. Group engaged in facilitated discussion or grief process.  Engaged with Worden's 4 tasks of grief.    

## 2018-03-22 NOTE — Progress Notes (Signed)
[facility] MD Progress Note  03/22/2018 1:16 PM Ronald Stanley  MRN:  [id number]   Subjective: Patient is reporting that he is feeling  " a lot better" today. He states that he feels  medications are working and he currently denies medication side effects. He reports that he has been sleeping well at night and that his appetite is improving.   Currently denies any SI/HI/AVH and contracts for safety.   As he improves he is focusing on being discharged soon.  Objective: Patient seen and case discussed with treatment team. 22 year old, single, lives with roommates/friends, in college, employed . Reports history of Bipolar Disorder , presented due to depression, crying , suicidal ideations, which he describes as passive . Of note, patient reports he was being treated with Lithium and had recently gone to ED for symptoms of Li toxicity, at which time dose was decreased . Patient does not feel this was related to recent worsening depression, however. At this time reports improving mood, states he is feeling better. Denies suicidal ideations. States sleep, which had been an issue prior to admission due to insomnia, has improved .  Currently denies medication side effects. Currently on Depakote ER, Seroquel. ( Depakote ER is new trial for patient, he had been on Seroquel prior to admission). Patient has been visible in day room, going to groups. He states he is looking forward to projected visit from grandparents tonight. Vitals are stable . Labs reviewed.    Principal Problem: Bipolar I disorder, most recent episode depressed ([place]) Diagnosis:   Patient Active Problem List   Diagnosis Date Noted  . Bipolar I disorder, most recent episode depressed ([place]) [F31.30] 03/21/2018   Total Time spent with patient: 20 minutes  Past Psychiatric History: See H&P  Past Medical History:  Past Medical History:  Diagnosis Date  . Depression     Past Surgical History:  Procedure Laterality Date  . HERNIA REPAIR     . MYRINGOTOMY WITH TUBE PLACEMENT     Family History:  Family History  Problem Relation Age of Onset  . Hyperlipidemia Father   . Hypertension Father   . Depression Mother    Family Psychiatric  History: See H&P Social History:  Social History   Substance and Sexual Activity  Alcohol Use No     Social History   Substance and Sexual Activity  Drug Use Yes  . Frequency: 1.0 times per week  . Types: Marijuana   Comment: pt reported that she take a couple pulls a week to help him sleep    Social History   Socioeconomic History  . Marital status: Single    Spouse name: Not on file  . Number of children: Not on file  . Years of education: Not on file  . Highest education level: Not on file  Occupational History  . Occupation: [employer]  . Financial resource strain: Not hard at all  . Food insecurity:    Worry: Never true    Inability: Never true  . Transportation needs:    Medical: Not on file    Non-medical: Not on file  Tobacco Use  . Smoking status: Never Smoker  . Smokeless tobacco: Never Used  Substance and Sexual Activity  . Alcohol use: No  . Drug use: Yes    Frequency: 1.0 times per week    Types: Marijuana    Comment: pt reported that she take a couple pulls a week to help him sleep  .  Sexual activity: Not Currently  Lifestyle  . Physical activity:    Days per week: 0 days    Minutes per session: 0 min  . Stress: Very much  Relationships  . Social connections:    Talks on phone: More than three times a week    Gets together: More than three times a week    Attends religious service: More than 4 times per year    Active member of club or organization: No    Attends meetings of clubs or organizations: Not on file    Relationship status: Never married  Other Topics Concern  . Not on file  Social History Narrative  . Not on file   Additional Social History:    Pain Medications: none Prescriptions: none Over the  Counter: none History of alcohol / drug use?: Yes  Sleep: Good  Appetite:  Improving  Current Medications: Current Facility-Administered Medications  Medication Dose Route Frequency Provider Last Rate Last Dose  . acetaminophen (TYLENOL) tablet 650 mg  650 mg Oral Q6H PRN Money, Lowry Ram, FNP      . alum & mag hydroxide-simeth (MAALOX/MYLANTA) 200-200-20 MG/5ML suspension 30 mL  30 mL Oral Q4H PRN Money, Darnelle Maffucci B, FNP      . divalproex (DEPAKOTE ER) 24 hr tablet 500 mg  500 mg Oral QPM Sharma Covert, MD   500 mg at 03/21/18 1659  . hydrOXYzine (ATARAX/VISTARIL) tablet 25 mg  25 mg Oral TID PRN Money, Lowry Ram, FNP   25 mg at 03/20/18 2134  . magnesium hydroxide (MILK OF MAGNESIA) suspension 30 mL  30 mL Oral Daily PRN Money, Lowry Ram, FNP      . QUEtiapine (SEROQUEL) tablet 200 mg  200 mg Oral QHS Sharma Covert, MD   200 mg at 03/21/18 2116  . traZODone (DESYREL) tablet 50 mg  50 mg Oral QHS PRN Money, Lowry Ram, FNP   50 mg at 03/21/18 2118    Lab Results:  Results for orders placed or performed during the hospital encounter of 03/20/18 (from the past 48 hour(s))  Urine rapid drug screen (hosp performed)not at [facility]     Status: Abnormal   Collection Time: 03/20/18  7:44 PM  Result Value Ref Range   Opiates NONE DETECTED NONE DETECTED   Cocaine NONE DETECTED NONE DETECTED   Benzodiazepines NONE DETECTED NONE DETECTED   Amphetamines NONE DETECTED NONE DETECTED   Tetrahydrocannabinol POSITIVE (A) NONE DETECTED   Barbiturates NONE DETECTED NONE DETECTED    Comment: (NOTE) DRUG SCREEN FOR MEDICAL PURPOSES ONLY.  IF CONFIRMATION IS NEEDED FOR ANY PURPOSE, NOTIFY LAB WITHIN 5 DAYS. LOWEST DETECTABLE LIMITS FOR URINE DRUG SCREEN Drug Class                     Cutoff (ng/mL) Amphetamine and metabolites    1000 Barbiturate and metabolites    200 Benzodiazepine                 528 Tricyclics and metabolites     300 Opiates and metabolites        300 Cocaine and metabolites         300 THC                            50 Performed at [facility], [place] [address], [place], [place] [address]   CBC     Status: None   Collection  Time: 03/21/18  6:31 AM  Result Value Ref Range   WBC 5.6 4.0 - 10.5 K/uL   RBC 5.57 4.22 - 5.81 MIL/uL   Hemoglobin 16.3 13.0 - 17.0 g/dL   HCT 47.6 39.0 - 52.0 %   MCV 85.5 78.0 - 100.0 fL   MCH 29.3 26.0 - 34.0 pg   MCHC 34.2 30.0 - 36.0 g/dL   RDW 12.3 11.5 - 15.5 %   Platelets 271 150 - 400 K/uL    Comment: Performed at [facility], [place] [address] [place], [place] [address]  Comprehensive metabolic panel     Status: Abnormal   Collection Time: 03/21/18  6:31 AM  Result Value Ref Range   Sodium 144 135 - 145 mmol/L   Potassium 4.1 3.5 - 5.1 mmol/L   Chloride 106 98 - 111 mmol/L   CO2 30 22 - 32 mmol/L   Glucose, Bld 83 70 - 99 mg/dL   BUN 10 6 - 20 mg/dL   Creatinine, Ser 1.05 0.61 - 1.24 mg/dL   Calcium 9.8 8.9 - 10.3 mg/dL   Total Protein 7.4 6.5 - 8.1 g/dL   Albumin 4.6 3.5 - 5.0 g/dL   AST 14 (L) 15 - 41 U/L   ALT 14 0 - 44 U/L   Alkaline Phosphatase 59 38 - 126 U/L   Total Bilirubin 0.9 0.3 - 1.2 mg/dL   GFR calc non Af Amer >60 >60 mL/min   GFR calc Af Amer >60 >60 mL/min    Comment: (NOTE) The eGFR has been calculated using the CKD EPI equation. This calculation has not been validated in all clinical situations. eGFR's persistently <60 mL/min signify possible Chronic Kidney Disease.    Anion gap 8 5 - 15    Comment: Performed at [facility], [place] [address], [place], [place] [address]  Hemoglobin A1c     Status: Abnormal   Collection Time: 03/21/18  6:31 AM  Result Value Ref Range   Hgb A1c MFr Bld 4.7 (L) 4.8 - 5.6 %    Comment: (NOTE) Pre diabetes:          5.7%-6.4% Diabetes:              >6.4% Glycemic control for   <7.0% adults with diabetes    Mean Plasma Glucose 88.19 mg/dL    Comment: Performed at [place] [address],  [place], [place] [address]  Lipid panel     Status: None   Collection Time: 03/21/18  6:31 AM  Result Value Ref Range   Cholesterol 146 0 - 200 mg/dL   Triglycerides 60 <150 mg/dL   HDL 43 >40 mg/dL   Total CHOL/HDL Ratio 3.4 RATIO   VLDL 12 0 - 40 mg/dL   LDL Cholesterol 91 0 - 99 mg/dL    Comment:        Total Cholesterol/HDL:CHD Risk Coronary Heart Disease Risk Table                     Men   Women  1/2 Average Risk   3.4   3.3  Average Risk       5.0   4.4  2 X Average Risk   9.6   7.1  3 X Average Risk  23.4   11.0        Use the calculated Patient Ratio above and the CHD Risk Table to determine the patient's CHD Risk.  ATP III CLASSIFICATION (LDL):  <100     mg/dL   Optimal  100-129  mg/dL   Near or Above                    Optimal  130-159  mg/dL   Borderline  160-189  mg/dL   High  >190     mg/dL   Very High Performed at [place] [address], [place], [place] [address]   TSH     Status: None   Collection Time: 03/21/18  6:31 AM  Result Value Ref Range   TSH 2.903 0.350 - 4.500 uIU/mL    Comment: Performed by a 3rd Generation assay with a functional sensitivity of <=0.01 uIU/mL. Performed at [facility], [place] [address], [place], [place] [address]   Lithium level     Status: Abnormal   Collection Time: 03/21/18  6:31 AM  Result Value Ref Range   Lithium Lvl <0.06 (L) 0.60 - 1.20 mmol/L    Comment: REPEATED TO VERIFY Performed at [place] [address], [place], [place] [address]     Blood Alcohol level:  No results found for: [facility]  Metabolic Disorder Labs: Lab Results  Component Value Date   HGBA1C 4.7 (L) 03/21/2018   MPG 88.19 03/21/2018   No results found for: PROLACTIN Lab Results  Component Value Date   CHOL 146 03/21/2018   TRIG 60 03/21/2018   HDL 43 03/21/2018   CHOLHDL 3.4 03/21/2018   VLDL 12 03/21/2018   LDLCALC 91 03/21/2018    Physical Findings: AIMS: Facial  and Oral Movements Muscles of Facial Expression: None, normal Lips and Perioral Area: None, normal Jaw: None, normal Tongue: None, normal,Extremity Movements Upper (arms, wrists, hands, fingers): None, normal Lower (legs, knees, ankles, toes): None, normal, Trunk Movements Neck, shoulders, hips: None, normal, Overall Severity Severity of abnormal movements (highest score from questions above): None, normal Incapacitation due to abnormal movements: None, normal Patient's awareness of abnormal movements (rate only patient's report): No Awareness, Dental Status Current problems with teeth and/or dentures?: No Does patient usually wear dentures?: No  CIWA:    COWS:     Musculoskeletal: Strength & Muscle Tone: within normal limits Gait & Station: normal Patient leans: N/A  Psychiatric Specialty Exam: Physical Exam  Nursing note and vitals reviewed. Constitutional: He is oriented to person, place, and time. He appears well-developed and well-nourished.  Cardiovascular: Normal rate.  Respiratory: Effort normal.  Musculoskeletal: Normal range of motion.  Neurological: He is alert and oriented to person, place, and time.  Skin: Skin is warm.    Review of Systems  Constitutional: Negative.   HENT: Negative.   Eyes: Negative.   Respiratory: Negative.   Cardiovascular: Negative.   Gastrointestinal: Negative.   Genitourinary: Negative.   Musculoskeletal: Negative.   Skin: Negative.   Neurological: Negative.   Endo/Heme/Allergies: Negative.   Psychiatric/Behavioral: Positive for depression. Negative for hallucinations and suicidal ideas. The patient is not nervous/anxious.   denies headache, no chest pain, no shortness of breath, no vomiting, no diarrhea, no tremors   Blood pressure 113/77, pulse (!) 53, temperature 98 F (36.7 C), temperature source Oral, resp. rate 20, height 6' (1.829 m), weight 63.5 kg, SpO2 100 %.Body mass index is 18.99 kg/m.  General Appearance: Well Groomed   Eye Contact:  Good  Speech:  Normal Rate  Volume:  Normal  Mood:  improved , reports " I feel a lot better"  Affect:  vaguely constricted, anxious at first, improves during session, smiles at times appropriately   Thought Process:  Goal Directed and Descriptions of Associations: Intact  Orientation:  Full (Time, Place, and Person)  Thought Content:  Denies hallucinations, no delusions, not internally preoccupied   Suicidal Thoughts:  No denies suicidal or self injurious ideations, denies homicidal or violent ideations  Homicidal Thoughts:  No  Memory:  recent and remote grossly intact   Judgement:  Other:  improving   Insight:  Improving   Psychomotor Activity:  Normal  Concentration:  Concentration: Good and Attention Span: Good  Recall:  Good  Fund of Knowledge:  Good  Language:  Good  Akathisia:  No  Handed:  Right  AIMS (if indicated):     Assets:  Communication Skills Desire for Improvement Financial Resources/Insurance Housing Physical Health Social Support Transportation  ADL's:  Intact  Cognition:  WNL  Sleep:  Number of Hours: 6.75   Assessment - 22 year old male, history of Bipolar Disorder, admitted due to worsening depression, passive SI. At this time reports significant improvement and states he is feeling a lot better. Denies SI , presents future oriented. Currently on Seroquel, Depakote, which he is tolerating well thus far .   Treatment Plan Summary: Daily contact with patient to assess and evaluate symptoms and progress in treatment, Medication management and Plan is to:  Encourage group and milieu participation to work on coping skills and symptom reduction -Continue Depakote ER 500 mg p.o. evening for mood disorder  -Continue Vistaril 25 mg p.o. 3 times daily as needed for anxiety -Continue Seroquel 200 mg p.o. nightly for mood disorder  -Continue Trazodone 50 mg p.o. nightly as needed for insomnia   Gabriel Earing, MD

## 2018-03-23 MED ORDER — QUETIAPINE FUMARATE 50 MG PO TABS
250.0000 mg | ORAL_TABLET | Freq: Every day | ORAL | Status: DC
  Administered 2018-03-23: 250 mg via ORAL
  Filled 2018-03-23 (×3): qty 1

## 2018-03-23 NOTE — Progress Notes (Signed)
Recreation Therapy Notes  Date: 9.4.19 Time: 0930 Location: [address] Dayroom  Group Topic: Stress Management  Goal Area(s) Addresses:  Patient will verbalize importance of using healthy stress management.  Patient will identify positive emotions associated with healthy stress management.   Intervention: Stress Management  Activity :  Guided Imagery.  LRT introduced the stress management technique of guided imagery.  LRT read a script that allowed patients to enjoy the summer clouds.  Patients were to follow along as LRT read script to engage in activity.  Education:  Stress Management, Discharge Planning.   Education Outcome: Acknowledges edcuation/In group clarification offered/Needs additional education  Clinical Observations/Feedback: Pt did not attend group.     L[place]ing Sigmon, LRT/CTRS         Gunnard Dorrance A 03/23/2018 11:06 AM 

## 2018-03-23 NOTE — Therapy (Signed)
Occupational Therapy Group Note  Date:  03/23/2018 Time:  3:05 PM  Group Topic/Focus:  Stress Management  Participation Level:  Active  Participation Quality:  Appropriate  Affect:  Flat  Cognitive:  Appropriate  Insight: Improving  Engagement in Group:  Engaged  Modes of Intervention:  Activity, Discussion, Education and Socialization  Additional Comments:    S: "I like to read when I am stressed"  O: Education given on stress management and healthy coping mechanisms. Pt encouraged to brainstorm with other peers and discuss what has worked in the past vs what has not. Pts further encouraged to discuss new coping stress management strategies to implement this date. Art activity made to display preferred coping mechanisms, along with incorporating the stress management outlet of coloring/art. Gratitude journaling and coloring handouts given at end of session.  A: Pt presents to group with flat affect, engaged and participatory throughout session. Pt brainstormed activities with other peers, stating he likes to read and watch movies when he is stressed. Pt engaged in art activity with success.    P: Pt provided with education on stress management activities to implement into daily routine. Handouts given to facilitate carryover when reintegrating into community    [facility], [place], OTR/L  [employer] 03/23/2018, 3:05 PM

## 2018-03-23 NOTE — Progress Notes (Signed)
[facility] MD Progress Note  03/23/2018 10:45 AM Ronald Stanley  MRN:  [id number]   Subjective: patient reports feeling better compared to admission. He denies medication side effects. States he feels Seroquel has been helpful, well tolerated, and expresses interest in increasing dose further. Denies suicidal ideations .   Objective: Patient seen and case discussed with treatment team. 22 year old, single, lives with roommates/friends, in college, employed . Reports history of Bipolar Disorder , presented due to depression, crying , suicidal ideations, which he describes as passive .He also endorses episodes of increased irritability, racing thoughts, limited sleep that can last several days. Patient reports he is feeling better.  Currently does not endorse symptoms of hypomania, no pressured speech is noted, no flight of ideations or racing thoughts are noted, no irritability or expansive affect, no grandiose ideations, reports she is been sleeping well.  Regarding depression, states his mood is improving, states "I feel better".  Affect remains vaguely constricted but improves as session progresses. Thus far tolerating Seroquel and Depakote ER well, without side effects.  Patient states Seroquel has been effective and well-tolerated and expresses optimism that Depakote will also be helpful.  As noted in prior notes, he had been managed with lithium prior to admission but is now off lithium and has had an episode of lithium toxicity prior to admission. RN staff reports patient seems partially improved but still presenting with a constricted/flat affect.  Limited group participation.  Polite and cooperative on approach.     Principal Problem: Bipolar I disorder, most recent episode depressed (HCC) Diagnosis:   Patient Active Problem List   Diagnosis Date Noted  . Bipolar I disorder, most recent episode depressed (HCC) [F31.30] 03/21/2018   Total Time spent with patient: 15 minutes  Past Psychiatric  History: See H&P  Past Medical History:  Past Medical History:  Diagnosis Date  . Depression     Past Surgical History:  Procedure Laterality Date  . HERNIA REPAIR    . MYRINGOTOMY WITH TUBE PLACEMENT     Family History:  Family History  Problem Relation Age of Onset  . Hyperlipidemia Father   . Hypertension Father   . Depression Mother    Family Psychiatric  History: See H&P Social History:  Social History   Substance and Sexual Activity  Alcohol Use No     Social History   Substance and Sexual Activity  Drug Use Yes  . Frequency: 1.0 times per week  . Types: Marijuana   Comment: pt reported that she take a couple pulls a week to help him sleep    Social History   Socioeconomic History  . Marital status: Single    Spouse name: Not on file  . Number of children: Not on file  . Years of education: Not on file  . Highest education level: Not on file  Occupational History  . Occupation: [employer]  Social Needs  . Financial resource strain: Not hard at all  . Food insecurity:    Worry: Never true    Inability: Never true  . Transportation needs:    Medical: Not on file    Non-medical: Not on file  Tobacco Use  . Smoking status: Never Smoker  . Smokeless tobacco: Never Used  Substance and Sexual Activity  . Alcohol use: No  . Drug use: Yes    Frequency: 1.0 times per week    Types: Marijuana    Comment: pt reported that she take a couple pulls a  week to help him sleep  . Sexual activity: Not Currently  Lifestyle  . Physical activity:    Days per week: 0 days    Minutes per session: 0 min  . Stress: Very much  Relationships  . Social connections:    Talks on phone: More than three times a week    Gets together: More than three times a week    Attends religious service: More than 4 times per year    Active member of club or organization: No    Attends meetings of clubs or organizations: Not on file    Relationship status: Never married   Other Topics Concern  . Not on file  Social History Narrative  . Not on file   Additional Social History:    Pain Medications: none Prescriptions: none Over the Counter: none History of alcohol / drug use?: Yes  Sleep: Good  Appetite:  Improving  Current Medications: Current Facility-Administered Medications  Medication Dose Route Frequency Provider Last Rate Last Dose  . acetaminophen (TYLENOL) tablet 650 mg  650 mg Oral Q6H PRN Money, Gerlene Burdock, FNP      . alum & mag hydroxide-simeth (MAALOX/MYLANTA) 200-200-20 MG/5ML suspension 30 mL  30 mL Oral Q4H PRN Money, Feliz Beam B, FNP      . divalproex (DEPAKOTE ER) 24 hr tablet 500 mg  500 mg Oral QPM Antonieta Pert, MD   500 mg at 03/22/18 1723  . hydrOXYzine (ATARAX/VISTARIL) tablet 25 mg  25 mg Oral TID PRN Money, Gerlene Burdock, FNP   25 mg at 03/20/18 2134  . magnesium hydroxide (MILK OF MAGNESIA) suspension 30 mL  30 mL Oral Daily PRN Money, Gerlene Burdock, FNP      . QUEtiapine (SEROQUEL) tablet 250 mg  250 mg Oral QHS Cobos, Fernando A, MD      . traZODone (DESYREL) tablet 50 mg  50 mg Oral QHS PRN Money, Gerlene Burdock, FNP   50 mg at 03/22/18 2116    Lab Results:  No results found for this or any previous visit (from the past 48 hour(s)).  Blood Alcohol level:  No results found for: [facility]  Metabolic Disorder Labs: Lab Results  Component Value Date   HGBA1C 4.7 (L) 03/21/2018   MPG 88.19 03/21/2018   No results found for: PROLACTIN Lab Results  Component Value Date   CHOL 146 03/21/2018   TRIG 60 03/21/2018   HDL 43 03/21/2018   CHOLHDL 3.4 03/21/2018   VLDL 12 03/21/2018   LDLCALC 91 03/21/2018    Physical Findings: AIMS: Facial and Oral Movements Muscles of Facial Expression: None, normal Lips and Perioral Area: None, normal Jaw: None, normal Tongue: None, normal,Extremity Movements Upper (arms, wrists, hands, fingers): None, normal Lower (legs, knees, ankles, toes): None, normal, Trunk Movements Neck, shoulders, hips:  None, normal, Overall Severity Severity of abnormal movements (highest score from questions above): None, normal Incapacitation due to abnormal movements: None, normal Patient's awareness of abnormal movements (rate only patient's report): No Awareness, Dental Status Current problems with teeth and/or dentures?: No Does patient usually wear dentures?: No  CIWA:    COWS:     Musculoskeletal: Strength & Muscle Tone: within normal limits Gait & Station: normal Patient leans: N/A  Psychiatric Specialty Exam: Physical Exam  Nursing note and vitals reviewed. Constitutional: He is oriented to person, place, and time. He appears well-developed and well-nourished.  Cardiovascular: Normal rate.  Respiratory: Effort normal.  Musculoskeletal: Normal range of motion.  Neurological: He is alert  and oriented to person, place, and time.  Skin: Skin is warm.    Review of Systems  Constitutional: Negative.   HENT: Negative.   Eyes: Negative.   Respiratory: Negative.   Cardiovascular: Negative.   Gastrointestinal: Negative.   Genitourinary: Negative.   Musculoskeletal: Negative.   Skin: Negative.   Neurological: Negative.   Endo/Heme/Allergies: Negative.   Psychiatric/Behavioral: Positive for depression. Negative for hallucinations and suicidal ideas. The patient is not nervous/anxious.   denies headache, no chest pain, no shortness of breath, no vomiting, no diarrhea, no tremors   Blood pressure 113/77, pulse (!) 53, temperature 98 F (36.7 C), temperature source Oral, resp. rate 20, height 6' (1.829 m), weight 63.5 kg, SpO2 100 %.Body mass index is 18.99 kg/m.  General Appearance: Well Groomed  Eye Contact:  Good  Speech:  Normal Rate  Volume:  Normal  Mood:  Reports improving mood, currently minimizes depression  Affect:  Vaguely constricted, improves during session, smiles briefly at times, appropriately  Thought Process:  Coherent and Descriptions of Associations: Intact   Orientation:  Full (Time, Place, and Person)  Thought Content:  Denies hallucinations, no delusions, not internally preoccupied   Suicidal Thoughts:  No denies suicidal or self injurious ideations, denies homicidal or violent ideations  Homicidal Thoughts:  No  Memory:  recent and remote grossly intact   Judgement:  Other:  improving   Insight:  Improving   Psychomotor Activity:  Normal  Concentration:  Concentration: Good and Attention Span: Good  Recall:  Good  Fund of Knowledge:  Good  Language:  Good  Akathisia:  No  Handed:  Right  AIMS (if indicated):     Assets:  Communication Skills Desire for Improvement Financial Resources/Insurance Housing Physical Health Social Support Transportation  ADL's:  Intact  Cognition:  WNL  Sleep:  Number of Hours: 6.75   Assessment - 22 year old male, history of Bipolar Disorder, admitted due to worsening depression, passive SI.  Patient reports improving mood, states he feels better than he did prior to admission, denies suicidal ideations at this time, future oriented and hopeful for discharge soon.  Affect remains vaguely constricted but more reactive than on admission.  Milieu participation has been limited.  Thus far tolerating Depakote ER and Seroquel well, denies side effects.  Treatment Plan Summary: Daily contact with patient to assess and evaluate symptoms and progress in treatment, Medication management and Plan is to:  Treatment plan reviewed as below today 9/4 Encourage group and milieu participation to work on coping skills and symptom reduction -Continue Depakote ER 500 mg p.o. evening for mood disorder  -Continue Vistaril 25 mg p.o. 3 times daily as needed for anxiety -Increase Seroquel to 250 mg p.o. nightly for mood disorder  -Continue Trazodone 50 mg p.o. nightly as needed for insomnia -Check valproic acid serum level in a.m. -Treatment team working on disposition planning   Sallyanne Havers, MD  Patient ID: Ronald Stanley,  male   DOB: Nov 11, 1995, 22 y.o.   MRN: [id number]

## 2018-03-23 NOTE — Plan of Care (Signed)
Progress note  D; pt found in bed; pt had no medications this morning. Pt has no complaints and only wants to know if he is being discharged to day. Pt stated he will achieve this by talking to the doctor hopefully. Pt denies any si/hi/ah/vh and verbally agrees to approach staff if these become apparent. Pt rates his depression/hopelessness/anxiety a 3/2/3 out of 10 respectively. Pt denies any physical pain and rates this a 0/10.  A: pt provided support and encouragement. Q3m safety checks implemented and continued. R: pt safe on the unit. Will continue to monitor.   Pt progressing in the following metrics  Problem: Education: Goal: Knowledge of [place] [facility] Education information/materials will improve Outcome: Progressing Goal: Emotional status will improve Outcome: Progressing Goal: Mental status will improve Outcome: Progressing Goal: Verbalization of understanding the information provided will improve Outcome: Progressing   Problem: Activity: Goal: Sleeping patterns will improve Outcome: Progressing   Problem: Coping: Goal: Ability to verbalize frustrations and anger appropriately will improve Outcome: Progressing

## 2018-03-23 NOTE — Progress Notes (Signed)
D: Pt appeared to be flat and depressed. Didn't go into any detail about his day or his adm to [facility]. Stated he hasn't had "any thoughts of suicide since he's been in here", referring to [facility]. Pt has no questions or concerns.    A:  Support and encouragement was offered. 15 min checks continued for safety.  R: Pt remains safe.

## 2018-03-23 NOTE — [facility] Group Notes (Signed)
[facility] Group Therapy 03/23/2018 1:15pm  Type of Therapy: Mental Health Association Presentation  Participation Level: Active  Participation Quality: Attentive  Affect: Appropriate  Cognitive: Oriented  Insight: Developing/Improving  Engagement in Therapy: Engaged  Modes of Intervention: Discussion, Education and Socialization  Summary of Progress/Problems: Mental Health Association (MHA) Speaker came to talk about his personal journey with mental health. The pt processed ways by which to relate to the speaker. MHA speaker provided handouts and educational information pertaining to groups and services offered by the [facility]. Pt was engaged in speaker's presentation and was receptive to resources provided.   Rona Ravens, LCSW 03/23/2018 11:51 AM

## 2018-03-23 NOTE — [facility] Suicide Risk Assessment (Signed)
[facility] INPATIENT:  Family/Significant Other Suicide Prevention Education  Suicide Prevention Education:  Education Completed; Rhae Hammock, mother, [phone], has been identified by the patient as the family member/significant other with whom the patient will be residing, and identified as the person(s) who will aid the patient in the event of a mental health crisis (suicidal ideations/suicide attempt).  With written consent from the patient, the family member/significant other has been provided the following suicide prevention education, prior to the and/or following the discharge of the patient.  The suicide prevention education provided includes the following:  Suicide risk factors  Suicide prevention and interventions  National Suicide Hotline telephone number  [facility] assessment telephone number  [facility] Emergency Assistance 911  [facility] and/or Residential Mobile Crisis Unit telephone number  Request made of family/significant other to:  Remove weapons (e.g., guns, rifles, knives), all items previously/currently identified as safety concern.  Mother not aware of any guns.  Remove drugs/medications (over-the-counter, prescriptions, illicit drugs), all items previously/currently identified as a safety concern.  The family member/significant other verbalizes understanding of the suicide prevention education information provided.  The family member/significant other agrees to remove the items of safety concern listed above.  Mother reports she has visited pt every night since he has been here.  He was doing very well last night during the visit--he was mainly talking about how he has been able to get sleep and how much that has helped.  She is not opposed to plan to discharge tomorrow, asked if he would get sleep medicine prescription at discharge.  She does talk to pt several times per week and will continue to check in regularly.  Lorri Frederick,  LCSW 03/23/2018, 11:42 AM

## 2018-03-24 ENCOUNTER — Encounter (HOSPITAL_COMMUNITY): Payer: Self-pay | Admitting: Behavioral Health

## 2018-03-24 LAB — VALPROIC ACID LEVEL: VALPROIC ACID LVL: 52 ug/mL (ref 50.0–100.0)

## 2018-03-24 MED ORDER — TRAZODONE HCL 50 MG PO TABS: 50.0000 mg | ORAL_TABLET | Freq: Every evening | ORAL | 0 refills | Status: DC | PRN

## 2018-03-24 MED ORDER — QUETIAPINE FUMARATE 50 MG PO TABS: 250.0000 mg | ORAL_TABLET | Freq: Every day | ORAL | 0 refills | Status: DC

## 2018-03-24 MED ORDER — HYDROXYZINE HCL 25 MG PO TABS
25.0000 mg | ORAL_TABLET | Freq: Three times a day (TID) | ORAL | 0 refills | Status: DC | PRN
Start: 2018-03-24 — End: 2019-06-10

## 2018-03-24 MED ORDER — DIVALPROEX SODIUM ER 500 MG PO TB24: 500.0000 mg | ORAL_TABLET | Freq: Every evening | ORAL | 0 refills | Status: DC

## 2018-03-24 NOTE — [facility] Suicide Risk Assessment (Signed)
[facility] Discharge Suicide Risk Assessment   Principal Problem: Bipolar I disorder, most recent episode depressed [facility]) Discharge Diagnoses:  Patient Active Problem List   Diagnosis Date Noted  . Bipolar I disorder, most recent episode depressed (HCC) [F31.30] 03/21/2018    Total Time spent with patient: 30 minutes  Musculoskeletal: Strength & Muscle Tone: within normal limits Gait & Station: normal Patient leans: N/A  Psychiatric Specialty Exam: ROS no headache, no chest pain, no shortness of breath, no vomiting, no tremors   Blood pressure 104/90, pulse 81, temperature 97.7 F (36.5 C), temperature source Oral, resp. rate 16, height 6' (1.829 m), weight 63.5 kg, SpO2 100 %.Body mass index is 18.99 kg/m.  General Appearance: Well Groomed  Eye Contact::  Good  Speech:  Normal Rate409  Volume:  Normal  Mood:  reports improved mood and states " I think my mood is back to normal". Presents euthymic  Affect:  Appropriate and reactive  Thought Process:  Linear and Descriptions of Associations: Intact  Orientation:  Full (Time, Place, and Person)  Thought Content:  no hallucinations, no delusions   Suicidal Thoughts:  No denies suicidal or self injurious ideations, denies homicidal or violent ideations  Homicidal Thoughts:  No  Memory:  recent and remote grossly intact   Judgement:  Other:  improving   Insight:  improving   Psychomotor Activity:  Normal  Concentration:  Good  Recall:  Good  Fund of Knowledge:Good  Language: Good  Akathisia:  Negative  Handed:  Right  AIMS (if indicated):     Assets:  Communication Skills Desire for Improvement Resilience  Sleep:  Number of Hours: 6.75  Cognition: WNL  ADL's:  Intact   Mental Status Per Nursing Assessment::   On Admission:  Suicidal ideation indicated by patient  Demographic Factors:  22 year old male, no children, in college, employed, was living with friends, plans to go live with his father for a period of a few days    Loss Factors: None identified   Historical Factors: Reports history of Bipolar Disorder diagnosis. No prior psychiatric admissions, no history of suicide attempts  Risk Reduction Factors:   Sense of responsibility to family, Employed, Living with another person, especially a relative and Positive coping skills or problem solving skills  Continued Clinical Symptoms:  Patient presents alert, attentive, calm, mood described as improved and back to normal, presents euthymic, affect appropriate, no thought disorder, no SI or HI, no psychotic symptoms, future oriented, plans to return to work early next week.  Reports sleep has improved significantly and is now sleeping " well/ the whole night" Denies medication side effects.  Behavior on unit in good control, pleasant on approach. Medication side effects reviewed , including importance of periodic blood draws for appropriate Depakote monitoring .   Cognitive Features That Contribute To Risk:  No gross cognitive deficits noted upon discharge. Is alert , attentive, and oriented x 3   Suicide Risk:  Mild:  Suicidal ideation of limited frequency, intensity, duration, and specificity.  There are no identifiable plans, no associated intent, mild dysphoria and related symptoms, good self-control (both objective and subjective assessment), few other risk factors, and identifiable protective factors, including available and accessible social support.  Follow-up Information    [facility]. Go on 03/25/2018.   Specialty:  Behavioral Health Why:  Please attend your medication appt with Anne Fu on Friday, 03/25/18, at 9:00am.  Please attend your therapy appt with Elio Forget on Monday, 04/04/18, at 3:00pm. Contact  information: [address] [place] [address] [phone]          Plan Of Care/Follow-up recommendations:  Activity:  as tolerated  Diet:  regular Tests:  NA Other:  See  below  Patient is expressing readiness for discharge and is leaving unit in good spirits  Plans to follow up as above   Craige Cotta, MD 03/24/2018, 8:06 AM

## 2018-03-24 NOTE — Progress Notes (Signed)
  [facility] Adult Case Management Discharge Plan :  Will you be returning to the same living situation after discharge:  Yes,  with roomate At discharge, do you have transportation home?: Yes,  dad Do you have the ability to pay for your medications: Yes,  BC[employer]  Release of information consent forms completed and in the chart;  Patient's signature needed at discharge.  Patient to Follow up at: Follow-up Information    [facility]. Go on 03/25/2018.   Specialty:  Behavioral Health Why:  Please attend your medication appt with Anne Fu on Friday, 03/25/18, at 9:00am.  Please attend your therapy appt with Elio Forget on Monday, 04/04/18, at 3:00pm. Contact information: [address], [address] [place] [address] [phone]          Next level of care provider has access to [facility] Link:no  Safety Planning and Suicide Prevention discussed: Yes,  with mother  Have you used any form of tobacco in the last 30 days? (Cigarettes, Smokeless Tobacco, Cigars, and/or Pipes): No  Has patient been referred to the Quitline?: N/A patient is not a smoker  Patient has been referred for addiction treatment: Pt. refused referral  Lorri Frederick, LCSW 03/24/2018, 10:46 AM

## 2018-03-24 NOTE — Plan of Care (Signed)
  Problem: Activity: Goal: Interest or engagement in activities will improve Outcome: Progressing Note:  Pt has been attending groups.   Problem: Activity: Goal: Sleeping patterns will improve Outcome: Progressing Note:  Pt reports sleeping well with current medications.

## 2018-03-24 NOTE — Progress Notes (Signed)
D:  Ronald Stanley was pleasant and cooperative.  He denied SI/HI or A/V hallucinations.  He denied pain or discomfort and appeared to be in no physical distress.  He stated that he is looking forward to tomorrow and hopefully be able to leave.  "I get to leave if my depakote level comes back ok."  PRN for sleep given with good relief. A:  1:1 with RN for support and encouragement.  Medications as ordered.  Q 15 minute checks maintained for safety.  Encouraged participation in group and unit activities.   R:  Ronald Stanley remains safe on the unit.  We will continue to monitor the progress towards his goals.

## 2018-03-24 NOTE — Discharge Summary (Addendum)
Physician Discharge Summary Note  Patient:  Ronald Stanley is an 22 y.o., male MRN:  [id number] DOB:  [birth year] Patient phone:  [phone] (home)  Patient address:   [place] [address],  Total Time spent with patient: 30 minutes  Date of Admission:  03/20/2018 Date of Discharge: 03/24/2018  Reason for Admission: .74 year old, single, lives with roommates/friends, in college, employed .Reports history of Bipolar Disorder , presented due to depression, crying , suicidal ideations, which he describes as passive .He also endorses episodes of increased irritability, racing thoughts, limited sleep that can last several days.  Principal Problem: Bipolar I disorder, most recent episode depressed [facility]) Discharge Diagnoses: Patient Active Problem List   Diagnosis Date Noted  . Bipolar I disorder, most recent episode depressed ([place]) [F31.30] 03/21/2018    Past Psychiatric History: Bipolar, manic and depressed  Past Medical History:  Past Medical History:  Diagnosis Date  . Depression     Past Surgical History:  Procedure Laterality Date  . HERNIA REPAIR    . MYRINGOTOMY WITH TUBE PLACEMENT     Family History:  Family History  Problem Relation Age of Onset  . Hyperlipidemia Father   . Hypertension Father   . Depression Mother    Family Psychiatric  History:  depression Social History:  Social History   Substance and Sexual Activity  Alcohol Use No     Social History   Substance and Sexual Activity  Drug Use Yes  . Frequency: 1.0 times per week  . Types: Marijuana   Comment: pt reported that she take a couple pulls a week to help him sleep    Social History   Socioeconomic History  . Marital status: Single    Spouse name: Not on file  . Number of children: Not on file  . Years of education: Not on file  . Highest education level: Not on file  Occupational History  . Occupation: [employer]  . Financial resource  strain: Not hard at all  . Food insecurity:    Worry: Never true    Inability: Never true  . Transportation needs:    Medical: Not on file    Non-medical: Not on file  Tobacco Use  . Smoking status: Never Smoker  . Smokeless tobacco: Never Used  Substance and Sexual Activity  . Alcohol use: No  . Drug use: Yes    Frequency: 1.0 times per week    Types: Marijuana    Comment: pt reported that she take a couple pulls a week to help him sleep  . Sexual activity: Not Currently  Lifestyle  . Physical activity:    Days per week: 0 days    Minutes per session: 0 min  . Stress: Very much  Relationships  . Social connections:    Talks on phone: More than three times a week    Gets together: More than three times a week    Attends religious service: More than 4 times per year    Active member of club or organization: No    Attends meetings of clubs or organizations: Not on file    Relationship status: Never married  Other Topics Concern  . Not on file  Social History Narrative  . Not on file    Hospital Course:  Pt is a 22yo caucasian male with a history of Bipolar 1 disorder, mixed. He has known manic phases in the past and was treated on Lithium  yet recently had Lithium toxicity and was admitted to the hospital 2 weeks ago. He was then titrated from 94m down to 6038m He was also taking Seroquel 80021mnd Risperidone 1mg93mily. Pt presented to the ED with complaints of not showering for many days at a time, losing motivation, and feeling very depressed overall. Pt would like to come off Lithium. Per Dr. ClarKarmen Stabses, pt also has a history of intermittent self-harm via cutting (right forearm, mildly visible).   HaydBodi started on medication regimen for presenting symptoms. He was medicated & discharged on;Depakote ER 500 mg p.o. evening for mood disorder, Vistaril 25 mg p.o. 3 times daily as needed for anxiety,  Seroquel to 250 mg p.o. nightly for mood disorder,  Trazodone 50 mg  p.o. nightly as needed for insomnia  Patient has been adherent with treatment recommendations. Patient tolerated the medications without any reported side effects are adverse reactions.  Patient was enrolled & participated in the group counseling sessions being offerred & held on this unit. Patient learned coping skills.  HaydKullenseen today by the attending psychiatrist for discharge. Patient denies any delusions, no hallucinations or other psychotic process. Patient denies active or passive suicidal thoughts. No thoughts of violence. No craving for drugs. Endorses overall improvement in mood emotional state.    Nursing staff reports that patient has been appropriate on the unit. Patient has been interacting well with peers. No behavioral issues. Patient has not voiced any suicidal thoughts. Prior to discharge. Patient was discussed at the treatment team meeting this morning. Team members feels that patient is back to his baseline level of functioning. Team agrees with plan to discharge patient today. Patient was provided with all follow-up information to resume mental health treatment following discharge as noted below.HaydDeniz [facility].  [facility] personal belongings in no apparent distress. Transportation per patient/ family arrangement.    Labs: Reviewed and noted as below  Physical Findings: AIMS: Facial and Oral Movements Muscles of Facial Expression: None, normal Lips and Perioral Area: None, normal Jaw: None, normal Tongue: None, normal,Extremity Movements Upper (arms, wrists, hands, fingers): None, normal Lower (legs, knees, ankles, toes): None, normal, Trunk Movements Neck, shoulders, hips: None, normal, Overall Severity Severity of abnormal movements (highest score from questions above): None, normal Incapacitation due to abnormal movements: None, normal Patient's awareness of abnormal movements (rate only  patient's report): No Awareness, Dental Status Current problems with teeth and/or dentures?: No Does patient usually wear dentures?: No  CIWA:    COWS:     Musculoskeletal: Strength & Muscle Tone: within normal limits Gait & Station: normal Patient leans: N/A  Psychiatric Specialty Exam: SEE SRA BY MD  Physical Exam  Nursing note and vitals reviewed. Constitutional: He is oriented to person, place, and time.  Neurological: He is alert and oriented to person, place, and time.    Review of Systems  Psychiatric/Behavioral: Negative for hallucinations, memory loss, substance abuse and suicidal ideas. Depression: stable. Nervous/anxious: stable. Insomnia: stable.   All other systems reviewed and are negative.   Blood pressure 104/90, pulse 81, temperature 97.7 F (36.5 C), temperature source Oral, resp. rate 16, height 6' (1.829 m), weight 63.5 kg, SpO2 100 %.Body mass index is 18.99 kg/m.   Have you used any form of tobacco in the last 30 days? (Cigarettes, Smokeless Tobacco, Cigars, and/or Pipes): No  Has this patient used any form of tobacco in the last 30 days? (  Cigarettes, Smokeless Tobacco, Cigars, and/or Pipes)  N/A  Recent Results (from the past 2160 hour(s))  Urine rapid drug screen (hosp performed)not at [facility]     Status: Abnormal   Collection Time: 03/20/18  7:44 PM  Result Value Ref Range   Opiates NONE DETECTED NONE DETECTED   Cocaine NONE DETECTED NONE DETECTED   Benzodiazepines NONE DETECTED NONE DETECTED   Amphetamines NONE DETECTED NONE DETECTED   Tetrahydrocannabinol POSITIVE (A) NONE DETECTED   Barbiturates NONE DETECTED NONE DETECTED    Comment: (NOTE) DRUG SCREEN FOR MEDICAL PURPOSES ONLY.  IF CONFIRMATION IS NEEDED FOR ANY PURPOSE, NOTIFY LAB WITHIN 5 DAYS. LOWEST DETECTABLE LIMITS FOR URINE DRUG SCREEN Drug Class                     Cutoff (ng/mL) Amphetamine and metabolites    1000 Barbiturate and metabolites    200 Benzodiazepine                  248 Tricyclics and metabolites     300 Opiates and metabolites        300 Cocaine and metabolites        300 THC                            50 Performed at [facility], [place] [address], [place], [place] [address]   CBC     Status: None   Collection Time: 03/21/18  6:31 AM  Result Value Ref Range   WBC 5.6 4.0 - 10.5 K/uL   RBC 5.57 4.22 - 5.81 MIL/uL   Hemoglobin 16.3 13.0 - 17.0 g/dL   HCT 47.6 39.0 - 52.0 %   MCV 85.5 78.0 - 100.0 fL   MCH 29.3 26.0 - 34.0 pg   MCHC 34.2 30.0 - 36.0 g/dL   RDW 12.3 11.5 - 15.5 %   Platelets 271 150 - 400 K/uL    Comment: Performed at [facility], [place] [address], [place], [place] [address]  Comprehensive metabolic panel     Status: Abnormal   Collection Time: 03/21/18  6:31 AM  Result Value Ref Range   Sodium 144 135 - 145 mmol/L   Potassium 4.1 3.5 - 5.1 mmol/L   Chloride 106 98 - 111 mmol/L   CO2 30 22 - 32 mmol/L   Glucose, Bld 83 70 - 99 mg/dL   BUN 10 6 - 20 mg/dL   Creatinine, Ser 1.05 0.61 - 1.24 mg/dL   Calcium 9.8 8.9 - 10.3 mg/dL   Total Protein 7.4 6.5 - 8.1 g/dL   Albumin 4.6 3.5 - 5.0 g/dL   AST 14 (L) 15 - 41 U/L   ALT 14 0 - 44 U/L   Alkaline Phosphatase 59 38 - 126 U/L   Total Bilirubin 0.9 0.3 - 1.2 mg/dL   GFR calc non Af Amer >60 >60 mL/min   GFR calc Af Amer >60 >60 mL/min    Comment: (NOTE) The eGFR has been calculated using the CKD EPI equation. This calculation has not been validated in all clinical situations. eGFR's persistently <60 mL/min signify possible Chronic Kidney Disease.    Anion gap 8 5 - 15    Comment: Performed at [facility], [place] [address] [place], [place] [address]  Hemoglobin A1c     Status: Abnormal   Collection Time: 03/21/18  6:31 AM  Result Value Ref Range  Hgb A1c MFr Bld 4.7 (L) 4.8 - 5.6 %    Comment: (NOTE) Pre diabetes:          5.7%-6.4% Diabetes:              >6.4% Glycemic control for   <7.0% adults with diabetes     Mean Plasma Glucose 88.19 mg/dL    Comment: Performed at [place] [address] [place], [place] [address]  Lipid panel     Status: None   Collection Time: 03/21/18  6:31 AM  Result Value Ref Range   Cholesterol 146 0 - 200 mg/dL   Triglycerides 60 <150 mg/dL   HDL 43 >40 mg/dL   Total CHOL/HDL Ratio 3.4 RATIO   VLDL 12 0 - 40 mg/dL   LDL Cholesterol 91 0 - 99 mg/dL    Comment:        Total Cholesterol/HDL:CHD Risk Coronary Heart Disease Risk Table                     Men   Women  1/2 Average Risk   3.4   3.3  Average Risk       5.0   4.4  2 X Average Risk   9.6   7.1  3 X Average Risk  23.4   11.0        Use the calculated Patient Ratio above and the CHD Risk Table to determine the patient's CHD Risk.        ATP III CLASSIFICATION (LDL):  <100     mg/dL   Optimal  100-129  mg/dL   Near or Above                    Optimal  130-159  mg/dL   Borderline  160-189  mg/dL   High  >190     mg/dL   Very High Performed at [place] [address], [place], [place] [address]   TSH     Status: None   Collection Time: 03/21/18  6:31 AM  Result Value Ref Range   TSH 2.903 0.350 - 4.500 uIU/mL    Comment: Performed by a 3rd Generation assay with a functional sensitivity of <=0.01 uIU/mL. Performed at [facility], [place] [address] [place], [place] [address]   Lithium level     Status: Abnormal   Collection Time: 03/21/18  6:31 AM  Result Value Ref Range   Lithium Lvl <0.06 (L) 0.60 - 1.20 mmol/L    Comment: REPEATED TO VERIFY Performed at [place] [address] [place], [place] [address]   Valproic acid level     Status: None   Collection Time: 03/24/18  6:39 AM  Result Value Ref Range   Valproic Acid Lvl 52 50.0 - 100.0 ug/mL    Comment: Performed at [facility], [place] [address] [place], [place] [address]    Blood Alcohol level:  No results found for: [facility]  Metabolic  Disorder Labs:  Lab Results  Component Value Date   HGBA1C 4.7 (L) 03/21/2018   MPG 88.19 03/21/2018   No results found for: PROLACTIN Lab Results  Component Value Date   CHOL 146 03/21/2018   TRIG 60 03/21/2018   HDL 43 03/21/2018   CHOLHDL 3.4 03/21/2018   VLDL 12 03/21/2018   LDLCALC 91 03/21/2018    See Psychiatric Specialty Exam and Suicide Risk Assessment completed by Attending Physician prior  to discharge.  Discharge destination:  Home  Is patient on multiple antipsychotic therapies at discharge:  No   Has Patient had three or more failed trials of antipsychotic monotherapy by history:  No  Recommended Plan for Multiple Antipsychotic Therapies: NA   Allergies as of 03/24/2018   No Known Allergies     Medication List    STOP taking these medications   escitalopram 10 MG tablet Commonly known as:  LEXAPRO   lithium carbonate 300 MG capsule   QUEtiapine 400 MG 24 hr tablet Commonly known as:  SEROQUEL XR Replaced by:  QUEtiapine 50 MG tablet   risperiDONE 1 MG tablet Commonly known as:  RISPERDAL     TAKE these medications     Indication  divalproex 500 MG 24 hr tablet Commonly known as:  DEPAKOTE ER Take 1 tablet (500 mg total) by mouth every evening.  Indication:  mood stabilization   hydrOXYzine 25 MG tablet Commonly known as:  ATARAX/VISTARIL Take 1 tablet (25 mg total) by mouth 3 (three) times daily as needed for anxiety.  Indication:  Feeling Anxious   QUEtiapine 50 MG tablet Commonly known as:  SEROQUEL Take 5 tablets (250 mg total) by mouth at bedtime. Replaces:  QUEtiapine 400 MG 24 hr tablet  Indication:  mood stabilization   traZODone 50 MG tablet Commonly known as:  DESYREL Take 1 tablet (50 mg total) by mouth at bedtime as needed for sleep.  Indication:  Trouble Sleeping      Follow-up Information    [facility]. Go on 03/25/2018.   Specialty:  Behavioral Health Why:  Please attend your medication appt with Comer Locket on Friday, 03/25/18, at 9:00am.  Please attend your therapy appt with Lanetta Inch on Stanley, 04/04/18, at 3:00pm. Contact information: [address], [place] [phone]          Follow-up recommendations: Follow up with your outpatient provided for any medical issues. Activity & diet as recommended by your primary care provider.  Comments:  Patient is instructed prior to discharge to: Take all medications as prescribed by his/her mental healthcare provider. Report any adverse effects and or reactions from the medicines to his/her outpatient provider promptly. Patient has been instructed & cautioned: To not engage in alcohol and or illegal drug use while on prescription medicines. In the event of worsening symptoms, patient is instructed to call the crisis hotline, 911 and or go to the nearest ED for appropriate evaluation and treatment of symptoms. To follow-up with his/her primary care provider for your other medical issues, concerns and or health care needs.  Signed: Mordecai Maes, NP 03/24/2018, 10:40 AM   Patient seen, Suicide Assessment Completed.  Disposition Plan Reviewed

## 2018-03-24 NOTE — Progress Notes (Addendum)
Nursing discharge note: Patient discharged home per MD order.  Patient received all personal belongings from unit and locker.  Reviewed AVS/transition record with patient and he/she indicated understanding.  Patient will follow up with [facility].  Patient left with prescriptions of his medications.  Patient denies any thoughts of self harm.  He/she left ambulatory with his dad.

## 2018-03-24 NOTE — Plan of Care (Signed)
  Problem: Education: Goal: Knowledge of [facility] Education information/materials will improve Outcome: Completed/Met Goal: Emotional status will improve Outcome: Completed/Met Goal: Mental status will improve Outcome: Completed/Met Goal: Verbalization of understanding the information provided will improve Outcome: Completed/Met   Problem: Activity: Goal: Interest or engagement in activities will improve Outcome: Completed/Met Goal: Sleeping patterns will improve Outcome: Completed/Met   Problem: Coping: Goal: Ability to verbalize frustrations and anger appropriately will improve Outcome: Completed/Met Goal: Ability to demonstrate self-control will improve Outcome: Completed/Met   Problem: Health Behavior/Discharge Planning: Goal: Identification of resources available to assist in meeting health care needs will improve Outcome: Completed/Met Goal: Compliance with treatment plan for underlying cause of condition will improve Outcome: Completed/Met   Problem: Physical Regulation: Goal: Ability to maintain clinical measurements within normal limits will improve Outcome: Completed/Met   Problem: Safety: Goal: Periods of time without injury will increase Outcome: Completed/Met   Problem: Health Behavior/Discharge Planning: Goal: Identification of resources available to assist in meeting health care needs will improve Outcome: Completed/Met   Problem: Coping: Goal: Ability to identify and develop effective coping behavior will improve Outcome: Completed/Met Goal: Ability to interact with others will improve Outcome: Completed/Met Goal: Demonstration of participation in decision-making regarding own care will improve Outcome: Completed/Met Goal: Ability to use eye contact when communicating with others will improve Outcome: Completed/Met

## 2018-03-25 DIAGNOSIS — F319 Bipolar disorder, unspecified: Secondary | ICD-10-CM | POA: Diagnosis not present

## 2018-04-04 DIAGNOSIS — F319 Bipolar disorder, unspecified: Secondary | ICD-10-CM | POA: Diagnosis not present

## 2018-04-07 DIAGNOSIS — F319 Bipolar disorder, unspecified: Secondary | ICD-10-CM | POA: Diagnosis not present

## 2018-05-02 ENCOUNTER — Ambulatory Visit: Payer: BLUE CROSS/BLUE SHIELD | Admitting: Mental Health

## 2018-05-05 ENCOUNTER — Ambulatory Visit: Payer: BLUE CROSS/BLUE SHIELD | Admitting: Psychiatry

## 2018-05-06 ENCOUNTER — Ambulatory Visit (INDEPENDENT_AMBULATORY_CARE_PROVIDER_SITE_OTHER): Payer: BLUE CROSS/BLUE SHIELD | Admitting: Psychiatry

## 2018-05-06 DIAGNOSIS — F28 Other psychotic disorder not due to a substance or known physiological condition: Secondary | ICD-10-CM

## 2018-05-06 DIAGNOSIS — F3170 Bipolar disorder, currently in remission, most recent episode unspecified: Secondary | ICD-10-CM | POA: Diagnosis not present

## 2018-05-06 DIAGNOSIS — F411 Generalized anxiety disorder: Secondary | ICD-10-CM | POA: Diagnosis not present

## 2018-05-06 DIAGNOSIS — T56891D Toxic effect of other metals, accidental (unintentional), subsequent encounter: Secondary | ICD-10-CM | POA: Diagnosis not present

## 2018-05-06 NOTE — Progress Notes (Signed)
[facility]  Patient ID: Ronald Stanley,  MRN: [id number]  PCP: System, Provider Not In  Date of Evaluation: 05/06/2018 Time spent:20 minutes   HISTORY/CURRENT STATUS: HPI patient is a 22 year old white male seen last 04/07/2018.  He was doing well at that time we continued all current medications.  Patient continues to do well.  Denies depression manic symptoms and no psychosis.  He does note insomnia most nights and describes as a sleep onset insomnia.  He is on Seroquel Depakote and trazodone at night.  There is no listing for him in the [place] controlled substance section.  Individual Medical History/ Review of Systems: Changes? :No  Allergies: Patient has no known allergies.  Current Medications:  Current Outpatient Medications:  .  divalproex (DEPAKOTE ER) 500 MG 24 hr tablet, Take 1 tablet (500 mg total) by mouth every evening., Disp: 30 tablet, Rfl: 0 .  hydrOXYzine (ATARAX/VISTARIL) 25 MG tablet, Take 1 tablet (25 mg total) by mouth 3 (three) times daily as needed for anxiety., Disp: 30 tablet, Rfl: 0 .  QUEtiapine (SEROQUEL) 50 MG tablet, Take 5 tablets (250 mg total) by mouth at bedtime., Disp: 150 tablet, Rfl: 0 .  traZODone (DESYREL) 50 MG tablet, Take 1 tablet (50 mg total) by mouth at bedtime as needed for sleep., Disp: 30 tablet, Rfl: 0 Medication Side Effects: None  Family Medical/ Social History: Changes? No  MENTAL HEALTH EXAM:  There were no vitals taken for this visit.There is no height or weight on file to calculate BMI.  General Appearance: Casual  Eye Contact:  Good  Speech:  Clear and Coherent  Volume:  Normal  Mood:  Euthymic  Affect:  Appropriate  Thought Process:  Linear  Orientation:  Full (Time, Place, and Person)  Thought Content: Logical   Suicidal Thoughts:  No  Homicidal Thoughts:  No  Memory:  normal  Judgement:  Good  Insight:  Good  Psychomotor Activity:  Normal  Concentration:  Concentration: Good  Recall:  Good   Fund of Knowledge: Good  Language: Good  Akathisia:  NA  AIMS (if indicated): na  Assets:  Desire for Improvement  ADL's:  Intact  Cognition: WNL  Prognosis:  Good    DIAGNOSES: bipolar , hx. Psychosis, anxiety, hx. Lithium toxicity  RECOMMENDATION: all rx were hand written. Recheck 6 weeks    Anne Fu, PA-C

## 2018-05-09 MED ORDER — QUETIAPINE FUMARATE 50 MG PO TABS: 50.0000 mg | ORAL_TABLET | Freq: Every day | ORAL | 1 refills | Status: DC

## 2018-05-09 MED ORDER — QUETIAPINE FUMARATE 200 MG PO TABS: 200.0000 mg | ORAL_TABLET | Freq: Every day | ORAL | 1 refills | Status: DC

## 2018-05-10 ENCOUNTER — Telehealth: Payer: Self-pay | Admitting: Psychiatry

## 2018-05-10 NOTE — Telephone Encounter (Signed)
Review of outside records.  Patient went to [facility] 03/05/2018 for lithium toxicity.  Urine drug screen was found to have methadone and marijuana.  He was hospitalized at [facility] from 03/20/2018 and 03/24/2018.  Diagnosis was depression with suicidal thoughts and racing thoughts,  bipolar 1 mixed, cutting area.

## 2018-05-24 DIAGNOSIS — J069 Acute upper respiratory infection, unspecified: Secondary | ICD-10-CM | POA: Diagnosis not present

## 2018-05-24 NOTE — Telephone Encounter (Signed)
Pt not seen. This note was a practice note

## 2018-06-06 ENCOUNTER — Other Ambulatory Visit: Payer: Self-pay

## 2018-06-06 MED ORDER — TRAZODONE HCL 50 MG PO TABS: 50.0000 mg | ORAL_TABLET | Freq: Every day | ORAL | 0 refills | Status: DC

## 2018-06-20 ENCOUNTER — Ambulatory Visit (INDEPENDENT_AMBULATORY_CARE_PROVIDER_SITE_OTHER): Payer: BLUE CROSS/BLUE SHIELD | Admitting: Psychiatry

## 2018-06-20 DIAGNOSIS — F3130 Bipolar disorder, current episode depressed, mild or moderate severity, unspecified: Secondary | ICD-10-CM

## 2018-06-20 DIAGNOSIS — R45851 Suicidal ideations: Secondary | ICD-10-CM

## 2018-06-20 DIAGNOSIS — F411 Generalized anxiety disorder: Secondary | ICD-10-CM

## 2018-06-20 DIAGNOSIS — F28 Other psychotic disorder not due to a substance or known physiological condition: Secondary | ICD-10-CM

## 2018-06-20 MED ORDER — QUETIAPINE FUMARATE 50 MG PO TABS: 50.0000 mg | ORAL_TABLET | Freq: Every day | ORAL | 1 refills | Status: DC

## 2018-06-20 MED ORDER — QUETIAPINE FUMARATE 200 MG PO TABS: 200.0000 mg | ORAL_TABLET | Freq: Every day | ORAL | 1 refills | Status: DC

## 2018-06-20 MED ORDER — TRAZODONE HCL 50 MG PO TABS: 50.0000 mg | ORAL_TABLET | Freq: Every day | ORAL | 1 refills | Status: DC

## 2018-06-20 MED ORDER — DIVALPROEX SODIUM ER 500 MG PO TB24: 1000.0000 mg | ORAL_TABLET | Freq: Every evening | ORAL | 1 refills | Status: DC

## 2018-06-20 NOTE — Progress Notes (Signed)
[facility]  Patient ID: Ronald Stanley,  MRN: [id number]  PCP: System, Provider Not In  Date of Evaluation: 06/20/2018 Time spent:20 minutes  Chief Complaint:   HISTORY/CURRENT STATUS: HPI patient last seen October 18 of this year.  Was doing well at the time and no medications were changed. He continues to do well.  Denies depression, denies anxiety,   He has had  2 times over the last 6 weeks where he had some manic symptoms of increased talking and decreased sleep.  each episode lasted about 2 days, most recent was 2 weeks ago. He denies any psychosis.  Denies cutting.  Denies self-harm.  Does admit to occasionally using pot but denies other drug use and no alcohol per patient.  Individual Medical History/ Review of Systems: Changes? :No   Allergies: Patient has no known allergies.  Current Medications:  Current Outpatient Medications:  .  divalproex (DEPAKOTE ER) 500 MG 24 hr tablet, Take 2 tablets (1,000 mg total) by mouth every evening., Disp: 60 tablet, Rfl: 1 .  hydrOXYzine (ATARAX/VISTARIL) 25 MG tablet, Take 1 tablet (25 mg total) by mouth 3 (three) times daily as needed for anxiety., Disp: 30 tablet, Rfl: 0 .  QUEtiapine (SEROQUEL) 200 MG tablet, Take 1 tablet (200 mg total) by mouth at bedtime., Disp: 30 tablet, Rfl: 1 .  QUEtiapine (SEROQUEL) 50 MG tablet, Take 1 tablet (50 mg total) by mouth at bedtime., Disp: 30 tablet, Rfl: 1 .  traZODone (DESYREL) 50 MG tablet, Take 1 tablet (50 mg total) by mouth at bedtime., Disp: 30 tablet, Rfl: 1 Medication Side Effects: none  Family Medical/ Social History: Changes? no  MENTAL HEALTH EXAM:  There were no vitals taken for this visit.There is no height or weight on file to calculate BMI.  General Appearance: Casual  Eye Contact:  Good  Speech:  Normal Rate  Volume:  Normal  Mood:  Euthymic  Affect:  Appropriate  Thought Process:  Linear  Orientation:  Full (Time, Place, and Person)  Thought Content: Logical    Suicidal Thoughts:  No  Homicidal Thoughts:  No  Memory:  WNL  Judgement:  Good  Insight:  Good  Psychomotor Activity:  Normal  Concentration:  Concentration: Good  Recall:  Good  Fund of Knowledge: Good  Language: Good  Assets:  Desire for Improvement  ADL's:  Intact  Cognition: WNL  Prognosis:  Good    DIAGNOSES:    ICD-10-CM   1. Bipolar I disorder, most recent episode depressed (HCC) F31.30   2. Other psychotic disorder not due to substance or known physiological condition (HCC) F28   3. Anxiety state F41.1     Receiving Psychotherapy: No    RECOMMENDATIONS: increase depakote er to 1000mg /day. Continue seroquel and trazadone. [place] drug screen. Consider lithium as hx. Suicidal thoughts.  Anne Fulay Roiza Wiedel, PA-C

## 2018-08-01 ENCOUNTER — Ambulatory Visit: Payer: BLUE CROSS/BLUE SHIELD | Admitting: Psychiatry

## 2018-09-27 ENCOUNTER — Other Ambulatory Visit: Payer: Self-pay | Admitting: Psychiatry

## 2019-03-14 DIAGNOSIS — R51 Headache: Secondary | ICD-10-CM | POA: Diagnosis not present

## 2019-03-14 DIAGNOSIS — R110 Nausea: Secondary | ICD-10-CM | POA: Diagnosis not present

## 2019-03-14 DIAGNOSIS — Z1159 Encounter for screening for other viral diseases: Secondary | ICD-10-CM | POA: Diagnosis not present

## 2019-04-19 DIAGNOSIS — F310 Bipolar disorder, current episode hypomanic: Secondary | ICD-10-CM | POA: Diagnosis not present

## 2019-04-28 ENCOUNTER — Emergency Department (INDEPENDENT_AMBULATORY_CARE_PROVIDER_SITE_OTHER): Payer: BC Managed Care – PPO

## 2019-04-28 ENCOUNTER — Other Ambulatory Visit: Payer: Self-pay

## 2019-04-28 ENCOUNTER — Emergency Department
Admission: EM | Admit: 2019-04-28 | Discharge: 2019-04-28 | Disposition: A | Discharge status: Home / Self Care | Payer: BC Managed Care – PPO | Source: Home / Self Care | Attending: Family Medicine | Admitting: Family Medicine

## 2019-04-28 DIAGNOSIS — R079 Chest pain, unspecified: Secondary | ICD-10-CM | POA: Diagnosis not present

## 2019-04-28 DIAGNOSIS — R1110 Vomiting, unspecified: Secondary | ICD-10-CM

## 2019-04-28 DIAGNOSIS — R112 Nausea with vomiting, unspecified: Secondary | ICD-10-CM

## 2019-04-28 DIAGNOSIS — M940 Chondrocostal junction syndrome [Tietze]: Secondary | ICD-10-CM

## 2019-04-28 LAB — POCT CBC W AUTO DIFF (K'VILLE URGENT CARE)

## 2019-04-28 MED ORDER — PREDNISONE 20 MG PO TABS: ORAL_TABLET | ORAL | 0 refills | Status: DC

## 2019-04-28 NOTE — ED Triage Notes (Signed)
Vomiting for the last 3 days.  Also having leg cramps and leg pain.

## 2019-04-28 NOTE — Discharge Instructions (Addendum)
Apply ice pack to front of chest for 20 to 30 minutes, 3 to 4 times daily  Continue until pain decreases.  Decrease Lamictal 25mg  to one tablet daily for now.  If symptoms become significantly worse during the night or over the weekend, proceed to the local emergency room.

## 2019-04-28 NOTE — ED Provider Notes (Signed)
[facility]    CSN: [id number] Arrival date & time: 04/28/19  1653      History   Chief Complaint Chief Complaint  Patient presents with  . Emesis    HPI Ronald Stanley is a 23 y.o. male.   Patient has bipolar depression.  His therapist recently discontinued several medications and started Lamictal 25mg  daily.  Patient's Lamictal was increased to 50mg  daily three days ago, and the next day he developed nausea/vomiting which has persisted intermittently.  Today he has felt somewhat light-headed with occasional leg cramps and onset today of pain in his anterior chest.  The history is provided by the patient.    Past Medical History:  Diagnosis Date  . Depression     Patient Active Problem List   Diagnosis Date Noted  . Bipolar I disorder, most recent episode depressed (HCC) 03/21/2018    Past Surgical History:  Procedure Laterality Date  . APPENDECTOMY    . HERNIA REPAIR    . MYRINGOTOMY WITH TUBE PLACEMENT         Home Medications    Prior to Admission medications   Medication Sig Start Date End Date Taking? Authorizing Provider  lamoTRIgine (LAMICTAL) 25 MG tablet Take 50 mg by mouth daily.   Yes [provider]  mirtazapine (REMERON) 15 MG tablet Take 15 mg by mouth at bedtime.   Yes [provider]  divalproex (DEPAKOTE ER) 500 MG 24 hr tablet Take 2 tablets (1,000 mg total) by mouth every evening. 06/20/18   Shugart, Mat Carnelay, PA-C  hydrOXYzine (ATARAX/VISTARIL) 25 MG tablet Take 1 tablet (25 mg total) by mouth 3 (three) times daily as needed for anxiety. 03/24/18   Denzil Magnusonhomas, Lashunda, NP  predniSONE (DELTASONE) 20 MG tablet Take one tab by mouth twice daily for 4 days, then one daily for 3 days. Take with food. 04/28/19   Lattie HawBeese, Dayanne Yiu A, MD  QUEtiapine (SEROQUEL) 200 MG tablet Take 1 tablet (200 mg total) by mouth at bedtime. 06/20/18   Shugart, Mat Carnelay, PA-C  QUEtiapine (SEROQUEL) 50 MG tablet Take 1 tablet (50 mg total) by mouth at  bedtime. 06/20/18   Shugart, Mat Carnelay, PA-C  traZODone (DESYREL) 50 MG tablet Take 1 tablet (50 mg total) by mouth at bedtime. 06/20/18   Anne FuShugart, Clay, PA-C    Family History Family History  Problem Relation Age of Onset  . Hyperlipidemia Father   . Hypertension Father   . Depression Mother     Social History Social History   Tobacco Use  . Smoking status: Never Smoker  . Smokeless tobacco: Never Used  Substance Use Topics  . Alcohol use: No  . Drug use: Yes    Frequency: 1.0 times per week    Types: Marijuana    Comment: pt reported that she take a couple pulls a week to help him sleep     Allergies   Patient has no known allergies.   Review of Systems Review of Systems No sore throat No cough No pleuritic pain, but has pain in his anterior chest No wheezing No nasal congestion No post-nasal drainage No sinus pain/pressure No itchy/red eyes No earache + light-headed No hemoptysis No SOB No fever/chills + nausea + vomiting No abdominal pain No diarrhea No urinary symptoms No skin rash No fatigue No myalgias No headache Used OTC meds without relief   Physical Exam Triage Vital Signs ED Triage Vitals  Enc Vitals Group     BP 04/28/19 1751 124/81  Pulse Rate 04/28/19 1751 88     Resp 04/28/19 1751 20     Temp 04/28/19 1751 99.3 F (37.4 C)     Temp Source 04/28/19 1751 Oral     SpO2 04/28/19 1751 100 %     Weight 04/28/19 1745 140 lb (63.5 kg)     Height 04/28/19 1745 6\' 1"  (1.854 m)     Head Circumference --      Peak Flow --      Pain Score 04/28/19 1745 5     Pain Loc --      Pain Edu? --      Excl. in [place]? --    No data found.  Updated Vital Signs BP 124/81 (BP Location: Right Arm)   Pulse 88   Temp 99.3 F (37.4 C) (Oral)   Resp 20   Ht 6\' 1"  (1.854 m)   Wt 63.5 kg   SpO2 100%   BMI 18.47 kg/m   Visual Acuity Right Eye Distance:   Left Eye Distance:   Bilateral Distance:    Right Eye Near:   Left Eye Near:    Bilateral  Near:     Physical Exam Nursing notes and Vital Signs reviewed. Appearance:  Patient appears stated age, and in no acute distress.    Psychiatric:  Patient is alert and oriented with good eye contact.  Thoughts are organized.  No psychomotor retardation.  Memory intact.  Not suicidal.  Mildly depressed mood and affect. Eyes:  Pupils are equal, round, and reactive to light and accomodation.  Extraocular movement is intact.  Conjunctivae are not inflamed   Pharynx:  Normal; moist mucous membranes  Neck:  Supple.  No adenopathy Lungs:  Clear to auscultation.  Breath sounds are equal.  Moving air well. Chest:  Distinct tenderness to palpation over the mid-sternum.  Heart:  Regular rate and rhythm without murmurs, rubs, or gallops.  Abdomen:  Nontender without masses or hepatosplenomegaly.  Bowel sounds are present.  No CVA or flank tenderness.  Extremities:  No edema.  Skin:  No rash present.     UC Treatments / Results  Labs (all labs ordered are listed, but only abnormal results are displayed) Labs Reviewed  POCT CBC W AUTO DIFF ([facility]):  WBC 6.6 ; LY 21.3; MO 5.4; GR 73.3; Hgb 15.6; Platelets 228     EKG   Radiology Dg Chest 2 View  Result Date: 04/28/2019 CLINICAL DATA:  23 year-old male c/o RIGHT sided chest pain and vomiting x 3 days. EXAM: CHEST - 2 VIEW COMPARISON:  None. FINDINGS: The heart size and mediastinal contours are within normal limits. The lungs are clear. No pneumothorax or pleural effusion. The visualized skeletal structures are unremarkable. IMPRESSION: No evidence of active disease. Electronically Signed   By: 06/28/2019 M.D.   On: 04/28/2019 18:48    Procedures Procedures (including critical care time)  Medications Ordered in UC Medications - No data to display  Initial Impression / Assessment and Plan / UC Course  I have reviewed the triage vital signs and the nursing notes.  Pertinent labs & imaging results that were available during  my care of the patient were reviewed by me and considered in my medical decision making (see chart for details).    Nausea and occasional vomiting probably a result of increasing dose of Lamictal 3 days ago. Normal chest x-ray and CBC reassuring.  Begin prednisone burst/taper. Followup with Dr Emmaline Kluver next week as scheduled.  Final Clinical Impressions(s) / UC Diagnoses   Final diagnoses:  Costochondritis  Nausea and vomiting in adult     Discharge Instructions     Apply ice pack to front of chest for 20 to 30 minutes, 3 to 4 times daily  Continue until pain decreases.  Decrease Lamictal 25mg  to one tablet daily for now.  If symptoms become significantly worse during the night or over the weekend, proceed to the local emergency room.     ED Prescriptions    Medication Sig Dispense Auth. Provider   predniSONE (DELTASONE) 20 MG tablet Take one tab by mouth twice daily for 4 days, then one daily for 3 days. Take with food. 11 tablet Kandra Nicolas, MD        Kandra Nicolas, MD 05/04/19 [phone]

## 2019-05-02 DIAGNOSIS — F310 Bipolar disorder, current episode hypomanic: Secondary | ICD-10-CM | POA: Diagnosis not present

## 2019-05-09 DIAGNOSIS — F250 Schizoaffective disorder, bipolar type: Secondary | ICD-10-CM | POA: Diagnosis not present

## 2019-06-06 DIAGNOSIS — F250 Schizoaffective disorder, bipolar type: Secondary | ICD-10-CM | POA: Diagnosis not present

## 2019-06-06 DIAGNOSIS — F519 Sleep disorder not due to a substance or known physiological condition, unspecified: Secondary | ICD-10-CM | POA: Diagnosis not present

## 2019-06-10 ENCOUNTER — Emergency Department
Admission: EM | Admit: 2019-06-10 | Discharge: 2019-06-10 | Disposition: A | Discharge status: Home / Self Care | Payer: BC Managed Care – PPO | Source: Home / Self Care | Attending: Family Medicine | Admitting: Family Medicine

## 2019-06-10 ENCOUNTER — Other Ambulatory Visit: Payer: Self-pay

## 2019-06-10 DIAGNOSIS — R112 Nausea with vomiting, unspecified: Secondary | ICD-10-CM

## 2019-06-10 DIAGNOSIS — R197 Diarrhea, unspecified: Secondary | ICD-10-CM | POA: Diagnosis not present

## 2019-06-10 LAB — POC SARS CORONAVIRUS 2 AG -  ED: SARS Coronavirus 2 Ag: NEGATIVE

## 2019-06-10 MED ORDER — ONDANSETRON 4 MG PO TBDP: ORAL_TABLET | ORAL | 0 refills | Status: DC

## 2019-06-10 NOTE — ED Provider Notes (Signed)
[facility]    CSN: [id number] Arrival date & time: 06/10/19  1026      History   Chief Complaint Chief Complaint  Patient presents with  . N/V/D  . Abdominal Cramping    HPI Ronald Stanley is a 23 y.o. male.   Patient reports that he ate lunch at [employer] three days ago.  The next day he awoke with abdominal bloating, cramping, and nausea/vomiting with diarrhea.  His diarrhea has almost ceased, and today he has persistent nausea.  He denies fevers, chills, and sweats, respiratory symptoms, chest tightness, and change in taste/smell.  The history is provided by the patient.    Past Medical History:  Diagnosis Date  . Depression     Patient Active Problem List   Diagnosis Date Noted  . Bipolar I disorder, most recent episode depressed ([place]) 03/21/2018    Past Surgical History:  Procedure Laterality Date  . APPENDECTOMY    . HERNIA REPAIR    . MYRINGOTOMY WITH TUBE PLACEMENT         Home Medications    Prior to Admission medications   Medication Sig Start Date End Date Taking? Authorizing Provider  modafinil (PROVIGIL) 100 MG tablet Take 100 mg by mouth daily.   Yes [provider]  OLANZapine (ZYPREXA) 10 MG tablet Take 10 mg by mouth at bedtime. 06/07/19   [provider]  ondansetron (ZOFRAN ODT) 4 MG disintegrating tablet Take one tab by mouth Q6hr prn nausea.  Dissolve under tongue. 06/10/19   Kandra Nicolas, MD    Family History Family History  Problem Relation Age of Onset  . Hyperlipidemia Father   . Hypertension Father   . Depression Mother     Social History Social History   Tobacco Use  . Smoking status: Never Smoker  . Smokeless tobacco: Never Used  Substance Use Topics  . Alcohol use: No  . Drug use: Yes    Frequency: 1.0 times per week    Types: Marijuana    Comment: pt reported that she take a couple pulls a week to help him sleep     Allergies   Patient has no known allergies.   Review of  Systems Review of Systems No sore throat No cough No pleuritic pain No wheezing No nasal congestion No post-nasal drainage No sinus pain/pressure No itchy/red eyes No earache No hemoptysis No SOB No fever/chills + nausea + vomiting No abdominal pain + diarrhea No urinary symptoms No skin rash + fatigue No myalgias No headache   Physical Exam Triage Vital Signs ED Triage Vitals [06/10/19 1125]  Enc Vitals Group     BP 109/72     Pulse Rate 63     Resp 18     Temp 97.8 F (36.6 C)     Temp Source Oral     SpO2 99 %     Weight 138 lb 14.2 oz (63 kg)     Height '6\' 1"'  (1.854 m)     Head Circumference      Peak Flow      Pain Score 0     Pain Loc      Pain Edu?      Excl. in [place]?    No data found.  Updated Vital Signs BP 109/72 (BP Location: Right Arm)   Pulse 63   Temp 97.8 F (36.6 C) (Oral)   Resp 18   Ht '6\' 1"'  (1.854 m)   Wt 63 kg  SpO2 99%   BMI 18.32 kg/m   Visual Acuity Right Eye Distance:   Left Eye Distance:   Bilateral Distance:    Right Eye Near:   Left Eye Near:    Bilateral Near:     Physical Exam Nursing notes and Vital Signs reviewed. Appearance:  Patient appears stated age, and in no acute distress Eyes:  Pupils are equal, round, and reactive to light and accomodation.  Extraocular movement is intact.  Conjunctivae are not inflamed  Ears:  Canals normal.  Tympanic membranes normal.  Nose:   Normal turbinates.  No sinus tenderness.   Pharynx:  Normal; moist mucous membranes  Neck:  Supple.  No adenopathy.  Lungs:  Clear to auscultation.  Breath sounds are equal.  Moving air well. Heart:  Regular rate and rhythm without murmurs, rubs, or gallops.  Abdomen:  Nontender without masses or hepatosplenomegaly.  Bowel sounds are present.  No CVA or flank tenderness.  Extremities:  No edema.  Skin:  No rash present.    UC Treatments / Results  Labs (all labs ordered are listed, but only abnormal results are displayed) Labs Reviewed   SARS-COV-2 RNA,(COVID-19) QUALITATIVE NAAT  POC SARS CORONAVIRUS 2 AG -  ED negative    EKG   Radiology No results found.  Procedures Procedures (including critical care time)  Medications Ordered in UC Medications - No data to display  Initial Impression / Assessment and Plan / UC Course  I have reviewed the triage vital signs and the nursing notes.  Pertinent labs & imaging results that were available during my care of the patient were reviewed by me and considered in my medical decision making (see chart for details).    Suspect viral gastroenteritis.  Treat symptomatically for now  Rx for Zofran ODT 35m Followup with Family Doctor if not improved in about 6 days.   Final Clinical Impressions(s) / UC Diagnoses   Final diagnoses:  Nausea vomiting and diarrhea     Discharge Instructions     Begin clear liquids (Pedialyte while having diarrhea) for 24 hours until improved, then advance to a [employer](Bananas, Rice, Applesauce, Toast).  Then gradually resume a regular diet when tolerated.  Avoid milk products until well.    If symptoms become significantly worse during the night or over the weekend, proceed to the local emergency room.  If COVID-19 test is positive, isolate yourself until the below conditions are met: 1)  At least 7 days since symptoms onset. AND 2)  > 72 hours after symptom resolution (absence of fever without the use of fever-reducing medicine, and improvement in respiratory symptoms.         ED Prescriptions    Medication Sig Dispense Auth. Provider   ondansetron (ZOFRAN ODT) 4 MG disintegrating tablet Take one tab by mouth Q6hr prn nausea.  Dissolve under tongue. 12 tablet BKandra Nicolas MD        BKandra Nicolas MD 06/12/19 [phone]

## 2019-06-10 NOTE — ED Triage Notes (Signed)
Pt c/o N/V/D since Thurs. Seems to think its food poisoning. Says he ate [employer] wed for lunch and woke up Thurs morning with abd cramping and N/V/D. Drinking water and gatorade prn.

## 2019-06-10 NOTE — Discharge Instructions (Addendum)
Begin clear liquids (Pedialyte while having diarrhea) for 24 hours until improved, then advance to a BRAT diet (Bananas, Rice, Applesauce, Toast).  Then gradually resume a regular diet when tolerated.  Avoid milk products until well.    ° °If symptoms become significantly worse during the night or over the weekend, proceed to the local emergency room. ° °If COVID-19 test is positive, isolate yourself until the below conditions are met: °1)  At least 7 days since symptoms onset. °AND °2)  > 72 hours after symptom resolution (absence of fever without the use of fever-reducing medicine, and improvement in respiratory symptoms. ° °   °

## 2019-06-13 LAB — SARS-COV-2 RNA,(COVID-19) QUALITATIVE NAAT: SARS CoV2 RNA: NOT DETECTED

## 2019-06-20 ENCOUNTER — Emergency Department (INDEPENDENT_AMBULATORY_CARE_PROVIDER_SITE_OTHER): Payer: BC Managed Care – PPO

## 2019-06-20 ENCOUNTER — Emergency Department (INDEPENDENT_AMBULATORY_CARE_PROVIDER_SITE_OTHER)
Admission: EM | Admit: 2019-06-20 | Discharge: 2019-06-20 | Disposition: A | Discharge status: Home / Self Care | Payer: BC Managed Care – PPO | Source: Home / Self Care | Attending: Family Medicine | Admitting: Family Medicine

## 2019-06-20 ENCOUNTER — Other Ambulatory Visit: Payer: Self-pay

## 2019-06-20 DIAGNOSIS — M79671 Pain in right foot: Secondary | ICD-10-CM

## 2019-06-20 DIAGNOSIS — M7989 Other specified soft tissue disorders: Secondary | ICD-10-CM | POA: Diagnosis not present

## 2019-06-20 DIAGNOSIS — S9030XA Contusion of unspecified foot, initial encounter: Secondary | ICD-10-CM | POA: Diagnosis not present

## 2019-06-20 MED ORDER — CEPHALEXIN 500 MG PO CAPS: 500.0000 mg | ORAL_CAPSULE | Freq: Two times a day (BID) | ORAL | 0 refills | Status: DC

## 2019-06-20 NOTE — Discharge Instructions (Addendum)
Change dressing daily and apply Bacitracin ointment to wound until healed.  Keep wound clean and dry. May take Ibuprofen 200mg , 4 tabs every 8 hours with food for 3 to 5 days.  May apply heating pad 2 or 3 times daily.

## 2019-06-20 NOTE — ED Provider Notes (Signed)
[facility]    CSN: [id number] Arrival date & time: 06/20/19  1409      History   Chief Complaint Chief Complaint  Patient presents with  . Foot Injury    HPI Ronald Stanley is a 23 y.o. male.   While lifting a heavy glass desk top about 10 days ago, it slipped and its edge hit the dorsum of his right foot.  He is still having swelling and mild pain.   Foot Injury Location:  Foot Time since incident:  10 days Injury: yes   Mechanism of injury: crush   Crush:    Mechanism:  Falling object Foot location:  Dorsum of L foot Pain details:    Quality:  Aching   Radiates to:  Does not radiate   Severity:  Mild   Onset quality:  Sudden   Duration:  10 days   Timing:  Constant   Progression:  Improving Chronicity:  New Prior injury to area:  No Relieved by:  None tried Exacerbated by: dorsiflexion of ankle. Ineffective treatments:  None tried Associated symptoms: stiffness and swelling   Associated symptoms: no back pain, no decreased ROM, no fever, no muscle weakness, no numbness and no tingling     Past Medical History:  Diagnosis Date  . Depression     Patient Active Problem List   Diagnosis Date Noted  . Bipolar I disorder, most recent episode depressed ([place]) 03/21/2018    Past Surgical History:  Procedure Laterality Date  . APPENDECTOMY    . HERNIA REPAIR    . MYRINGOTOMY WITH TUBE PLACEMENT         Home Medications    Prior to Admission medications   Medication Sig Start Date End Date Taking? Authorizing Provider  cephALEXin (KEFLEX) 500 MG capsule Take 1 capsule (500 mg total) by mouth 2 (two) times daily. 06/20/19   Kandra Nicolas, MD  modafinil (PROVIGIL) 100 MG tablet Take 100 mg by mouth daily.    [provider]  OLANZapine (ZYPREXA) 10 MG tablet Take 10 mg by mouth at bedtime. 06/07/19   [provider]  ondansetron (ZOFRAN ODT) 4 MG disintegrating tablet Take one tab by mouth Q6hr prn nausea.  Dissolve  under tongue. 06/10/19   Kandra Nicolas, MD    Family History Family History  Problem Relation Age of Onset  . Hyperlipidemia Father   . Hypertension Father   . Depression Mother     Social History Social History   Tobacco Use  . Smoking status: Never Smoker  . Smokeless tobacco: Never Used  Substance Use Topics  . Alcohol use: No  . Drug use: Yes    Frequency: 1.0 times per week    Types: Marijuana    Comment: pt reported that she take a couple pulls a week to help him sleep     Allergies   Patient has no known allergies.   Review of Systems Review of Systems  Constitutional: Negative for fever.  Musculoskeletal: Positive for stiffness. Negative for back pain.  All other systems reviewed and are negative.    Physical Exam Triage Vital Signs ED Triage Vitals  Enc Vitals Group     BP 06/20/19 1424 113/75     Pulse Rate 06/20/19 1424 64     Resp 06/20/19 1424 20     Temp 06/20/19 1424 (!) 97.5 F (36.4 C)     Temp Source 06/20/19 1424 Oral     SpO2 06/20/19 1424 100 %  Weight 06/20/19 1425 147 lb (66.7 kg)     Height 06/20/19 1425 6' (1.829 m)     Head Circumference --      Peak Flow --      Pain Score 06/20/19 1425 7     Pain Loc --      Pain Edu? --      Excl. in [place]? --    No data found.  Updated Vital Signs BP 113/75 (BP Location: Right Arm)   Pulse 64   Temp (!) 97.5 F (36.4 C) (Oral)   Resp 20   Ht 6' (1.829 m)   Wt 66.7 kg   SpO2 100%   BMI 19.94 kg/m   Visual Acuity Right Eye Distance:   Left Eye Distance:   Bilateral Distance:    Right Eye Near:   Left Eye Near:    Bilateral Near:     Physical Exam Vitals signs and nursing note reviewed.  Constitutional:      General: He is not in acute distress. HENT:     Head: Atraumatic.  Eyes:     Pupils: Pupils are equal, round, and reactive to light.  Cardiovascular:     Rate and Rhythm: Normal rate.  Pulmonary:     Effort: Pulmonary effort is normal.  Musculoskeletal:      Right foot: Normal capillary refill. Tenderness present. No bony tenderness, swelling, crepitus, deformity or laceration.       Feet:     Comments: Dorsum of right foot has two healing small abrasions with minimal surrounding erythema.  There is tenderness to palpation over the dorsal extensor tendons with dorsiflexion of his right ankle.  Skin:    General: Skin is warm and dry.  Neurological:     Mental Status: He is alert.      UC Treatments / Results  Labs (all labs ordered are listed, but only abnormal results are displayed) Labs Reviewed - No data to display  EKG   Radiology Dg Foot Complete Right  Result Date: 06/20/2019 CLINICAL DATA:  Glass top desk fell on foot 1 week ago with persistent pain, initial encounter EXAM: RIGHT FOOT COMPLETE - 3+ VIEW COMPARISON:  None. FINDINGS: Mild soft tissue swelling is noted over the metatarsals. No acute fracture or dislocation is noted. No radiopaque foreign body is seen although glass shards can be radiolucent. IMPRESSION: Soft tissue swelling without acute bony abnormality. Electronically Signed   By: Alcide CleverMark  Lukens M.D.   On: 06/20/2019 14:53    Procedures Procedures (including critical care time)  Medications Ordered in UC Medications - No data to display  Initial Impression / Assessment and Plan / UC Course  I have reviewed the triage vital signs and the nursing notes.  Pertinent labs & imaging results that were available during my care of the patient were reviewed by me and considered in my medical decision making (see chart for details).    Suspect component of extensor tendonitis.  Will also treat for mild cellulitis with Keflex. Followup with Dr. Rodney Langtonhomas [facility]and[facility] if not improving about two weeks.    Final Clinical Impressions(s) / UC Diagnoses   Final diagnoses:  Contusion of dorsum of foot     Discharge Instructions     Change dressing daily and apply Bacitracin ointment to wound until  healed.  Keep wound clean and dry. May take Ibuprofen 200mg , 4 tabs every 8 hours with food for 3 to 5 days.  May apply heating pad 2 or  3 times daily.    ED Prescriptions    Medication Sig Dispense Auth. Provider   cephALEXin (KEFLEX) 500 MG capsule Take 1 capsule (500 mg total) by mouth 2 (two) times daily. 14 capsule Lattie Haw, MD        Lattie Haw, MD 06/23/19 [phone]

## 2019-06-20 NOTE — ED Triage Notes (Signed)
t was moving a glass top desk about 10 days ago, and glass fell on top of right foot.  To is still having swelling and pain.

## 2019-06-26 DIAGNOSIS — F250 Schizoaffective disorder, bipolar type: Secondary | ICD-10-CM | POA: Diagnosis not present

## 2019-06-26 DIAGNOSIS — F519 Sleep disorder not due to a substance or known physiological condition, unspecified: Secondary | ICD-10-CM | POA: Diagnosis not present

## 2019-07-04 DIAGNOSIS — F250 Schizoaffective disorder, bipolar type: Secondary | ICD-10-CM | POA: Diagnosis not present

## 2019-07-04 DIAGNOSIS — F519 Sleep disorder not due to a substance or known physiological condition, unspecified: Secondary | ICD-10-CM | POA: Diagnosis not present

## 2019-08-12 DIAGNOSIS — Z20828 Contact with and (suspected) exposure to other viral communicable diseases: Secondary | ICD-10-CM | POA: Diagnosis not present

## 2019-09-04 DIAGNOSIS — F519 Sleep disorder not due to a substance or known physiological condition, unspecified: Secondary | ICD-10-CM | POA: Diagnosis not present

## 2019-09-04 DIAGNOSIS — F250 Schizoaffective disorder, bipolar type: Secondary | ICD-10-CM | POA: Diagnosis not present

## 2019-10-16 DIAGNOSIS — F250 Schizoaffective disorder, bipolar type: Secondary | ICD-10-CM | POA: Diagnosis not present

## 2019-10-16 DIAGNOSIS — F519 Sleep disorder not due to a substance or known physiological condition, unspecified: Secondary | ICD-10-CM | POA: Diagnosis not present

## 2019-11-06 DIAGNOSIS — F250 Schizoaffective disorder, bipolar type: Secondary | ICD-10-CM | POA: Diagnosis not present

## 2019-11-06 DIAGNOSIS — F519 Sleep disorder not due to a substance or known physiological condition, unspecified: Secondary | ICD-10-CM | POA: Diagnosis not present

## 2020-02-05 DIAGNOSIS — F519 Sleep disorder not due to a substance or known physiological condition, unspecified: Secondary | ICD-10-CM | POA: Diagnosis not present

## 2020-02-05 DIAGNOSIS — F250 Schizoaffective disorder, bipolar type: Secondary | ICD-10-CM | POA: Diagnosis not present

## 2020-02-15 ENCOUNTER — Ambulatory Visit (HOSPITAL_COMMUNITY)
Admission: EM | Admit: 2020-02-15 | Discharge: 2020-02-16 | Disposition: A | Discharge status: Home / Self Care | Payer: BC Managed Care – PPO | Attending: Psychiatry | Admitting: Psychiatry

## 2020-02-15 ENCOUNTER — Other Ambulatory Visit: Payer: Self-pay

## 2020-02-15 ENCOUNTER — Ambulatory Visit (HOSPITAL_COMMUNITY)
Admission: RE | Admit: 2020-02-15 | Discharge: 2020-02-15 | Disposition: A | Discharge status: Home / Self Care | Payer: BC Managed Care – PPO | Attending: Psychiatry | Admitting: Psychiatry

## 2020-02-15 DIAGNOSIS — F3130 Bipolar disorder, current episode depressed, mild or moderate severity, unspecified: Secondary | ICD-10-CM | POA: Diagnosis not present

## 2020-02-15 DIAGNOSIS — Z79899 Other long term (current) drug therapy: Secondary | ICD-10-CM | POA: Diagnosis not present

## 2020-02-15 DIAGNOSIS — F314 Bipolar disorder, current episode depressed, severe, without psychotic features: Secondary | ICD-10-CM | POA: Diagnosis not present

## 2020-02-15 DIAGNOSIS — F1490 Cocaine use, unspecified, uncomplicated: Secondary | ICD-10-CM | POA: Diagnosis not present

## 2020-02-15 DIAGNOSIS — F1290 Cannabis use, unspecified, uncomplicated: Secondary | ICD-10-CM | POA: Insufficient documentation

## 2020-02-15 DIAGNOSIS — R45851 Suicidal ideations: Secondary | ICD-10-CM | POA: Insufficient documentation

## 2020-02-15 DIAGNOSIS — Z915 Personal history of self-harm: Secondary | ICD-10-CM | POA: Diagnosis not present

## 2020-02-15 LAB — COMPREHENSIVE METABOLIC PANEL
ALT: 17 U/L (ref 0–44)
AST: 19 U/L (ref 15–41)
Albumin: 4.8 g/dL (ref 3.5–5.0)
Alkaline Phosphatase: 57 U/L (ref 38–126)
Anion gap: 12 (ref 5–15)
BUN: 9 mg/dL (ref 6–20)
CO2: 26 mmol/L (ref 22–32)
Calcium: 9.6 mg/dL (ref 8.9–10.3)
Chloride: 105 mmol/L (ref 98–111)
Creatinine, Ser: 0.85 mg/dL (ref 0.61–1.24)
GFR calc Af Amer: >60 mL/min (ref >60)
GFR calc non Af Amer: >60 mL/min (ref >60)
Glucose, Bld: 69 mg/dL — ABNORMAL LOW (ref 70–99)
Potassium: 3.9 mmol/L (ref 3.5–5.1)
Sodium: 143 mmol/L (ref 135–145)
Total Bilirubin: 1 mg/dL (ref 0.3–1.2)
Total Protein: 7.2 g/dL (ref 6.5–8.1)

## 2020-02-15 LAB — CBC WITH DIFFERENTIAL/PLATELET
Abs Immature Granulocytes: 0.01 10*3/uL (ref 0.00–0.07)
Basophils Absolute: 0 10*3/uL (ref 0.0–0.1)
Basophils Relative: 1 %
Eosinophils Absolute: 0.1 10*3/uL (ref 0.0–0.5)
Eosinophils Relative: 1 %
HCT: 46.9 % (ref 39.0–52.0)
Hemoglobin: 15.7 g/dL (ref 13.0–17.0)
Immature Granulocytes: 0 %
Lymphocytes Relative: 28 %
Lymphs Abs: 1.6 10*3/uL (ref 0.7–4.0)
MCH: 29 pg (ref 26.0–34.0)
MCHC: 33.5 g/dL (ref 30.0–36.0)
MCV: 86.5 fL (ref 80.0–100.0)
Monocytes Absolute: 0.3 10*3/uL (ref 0.1–1.0)
Monocytes Relative: 5 %
Neutro Abs: 3.7 10*3/uL (ref 1.7–7.7)
Neutrophils Relative %: 65 %
Platelets: 219 10*3/uL (ref 150–400)
RBC: 5.42 MIL/uL (ref 4.22–5.81)
RDW: 11.9 % (ref 11.5–15.5)
WBC: 5.8 10*3/uL (ref 4.0–10.5)
nRBC: 0 % (ref 0.0–0.2)

## 2020-02-15 LAB — SARS CORONAVIRUS 2 BY RT PCR (HOSPITAL ORDER, PERFORMED IN ~~LOC~~ HOSPITAL LAB): SARS Coronavirus 2: NEGATIVE

## 2020-02-15 LAB — POC SARS CORONAVIRUS 2 AG -  ED: SARS Coronavirus 2 Ag: NEGATIVE

## 2020-02-15 LAB — TSH: TSH: 0.983 u[IU]/mL (ref 0.350–4.500)

## 2020-02-15 LAB — POCT URINE DRUG SCREEN - MANUAL ENTRY (I-SCREEN)
POC Amphetamine UR: NOT DETECTED
POC Buprenorphine (BUP): NOT DETECTED
POC Cocaine UR: POSITIVE — AB
POC Marijuana UR: POSITIVE — AB
POC Methadone UR: NOT DETECTED
POC Methamphetamine UR: NOT DETECTED
POC Morphine: NOT DETECTED
POC Oxazepam (BZO): NOT DETECTED
POC Oxycodone UR: NOT DETECTED
POC Secobarbital (BAR): NOT DETECTED

## 2020-02-15 LAB — HEMOGLOBIN A1C
Hgb A1c MFr Bld: 4.7 % — ABNORMAL LOW (ref 4.8–5.6)
Mean Plasma Glucose: 88.19 mg/dL

## 2020-02-15 LAB — ETHANOL: Alcohol, Ethyl (B): <10 mg/dL (ref <10)

## 2020-02-15 MED ORDER — MIRTAZAPINE 15 MG PO TABS
15.0000 mg | ORAL_TABLET | Freq: Every day | ORAL | Status: DC
  Administered 2020-02-15: 15 mg via ORAL
  Filled 2020-02-15: qty 1

## 2020-02-15 MED ORDER — ALUM & MAG HYDROXIDE-SIMETH 200-200-20 MG/5ML PO SUSP: 30.0000 mL | ORAL | Status: DC | PRN

## 2020-02-15 MED ORDER — MAGNESIUM HYDROXIDE 400 MG/5ML PO SUSP: 30.0000 mL | Freq: Every day | ORAL | Status: DC | PRN

## 2020-02-15 MED ORDER — OLANZAPINE 2.5 MG PO TABS
2.5000 mg | ORAL_TABLET | Freq: Once | ORAL | Status: AC
  Administered 2020-02-15: 2.5 mg via ORAL
  Filled 2020-02-15: qty 1

## 2020-02-15 MED ORDER — HYDROXYZINE HCL 25 MG PO TABS: 25.0000 mg | ORAL_TABLET | Freq: Three times a day (TID) | ORAL | Status: DC | PRN

## 2020-02-15 MED ORDER — ACETAMINOPHEN 325 MG PO TABS: 650.0000 mg | ORAL_TABLET | Freq: Four times a day (QID) | ORAL | Status: DC | PRN

## 2020-02-15 MED ORDER — OLANZAPINE 7.5 MG PO TABS
15.0000 mg | ORAL_TABLET | Freq: Every day | ORAL | Status: DC
  Administered 2020-02-15: 15 mg via ORAL
  Filled 2020-02-15: qty 2

## 2020-02-15 NOTE — ED Notes (Signed)
Pt A&O, calm & cooperative. Resting in pull out through evening. Denies SI/HI/AVH. Comfort & support given, encouraged to ask for grief counseling references tomorrow from social worker to help manage emotions following  Grandfather's recent death.

## 2020-02-15 NOTE — ED Notes (Signed)
Patient had no belongings in a locker. Just a book at bed side.

## 2020-02-15 NOTE — H&P (Signed)
Behavioral Health Medical Screening Exam  Ronald Stanley is an 24 y.o. male.wo presented to [facility], voluntarily, as a walk-in accompanied by his father. Patient endorsed suicidal thoughts although he denied plan or intent. He reported a history of Bipolar, manic depressive disorder and added that for the past month, he has had a downward trend in mood. He described mood as depressed with associated symptoms of feelings of hopelessness, worthlessness, guilt, anhedonia, decreased motivation, decreased sleep, and suicidal thoughts. He reoccurring suicidal thoughts thoughts that occur at least once per day. He stated he superficially cut himself on the forearms yesterday but denied that this was a suicide attempt.  He reported a history of cutting behaviors. He denied prior suicide attempts. Re[ported one prior psychiatric hospitalization  here at [facility] in 2019 following depression and suicidal thoughts Stated that he is currently taking Modafinil 100 mg, Zyprexa 10 mg, Latuda 40 mg (increased to 40 mg a week and a half ago), and Remeron 15-30 mg which is prescribed by Dan Maker, [facility]. He denied having a therapist. Reported smoking marijuana and stated two weeks ago, he started snorting cocaine. Reported occasional use of alcohol. Denied other substance abuse or use. He denied access to firearms .He stated that the most recent trigger for his depression and SI was his grandfather passing away last week. He denied homicidal thoughts. When asked about hallucinations he replied," I hear things calling my name and see things out the corner of my eye." He denies command AH. At this time, he was unable to contract for safety.    Father stated that he was concerned about his downward mood. Stated," when he has felt really bad in the past to the point he could control his thoughts or mood he asked for help which is what he did this time. I think therapy would be very helpful but he knows himself the best and  if he thinks that he cant trust himself I think going into the hospital woud be helpful;. I trust his decision."     Total Time spent with patient: 20 minutes  Psychiatric Specialty Exam: Physical Exam Psychiatric:        Behavior: Behavior normal.        Thought Content: Thought content normal.        Judgment: Judgment normal.     Comments: Depression     Review of Systems  Psychiatric/Behavioral: Positive for suicidal ideas.       Depression    There were no vitals taken for this visit.There is no height or weight on file to calculate BMI. General Appearance: Fairly Groomed Eye Contact:  Good Speech:  Clear and Coherent and Normal Rate Volume:  Normal Mood:  Depressed Affect:  Depressed Thought Process:  Coherent, Linear and Descriptions of Associations: Intact Orientation:  Full (Time, Place, and Person) Thought Content:  Logical Suicidal Thoughts:  Yes.  without intent/plan Homicidal Thoughts:  No Memory:  Immediate;   Fair Recent;   Fair Judgement:  Fair Insight:  Fair Psychomotor Activity:  Normal Concentration: Concentration: Fair and Attention Span: Fair Recall:  [employer] of Knowledge:Fair Language: Good Akathisia:  Negative Handed:  Right AIMS (if indicated):    Assets:  Communication Skills Desire for Improvement Resilience Social Support Sleep:     Musculoskeletal: Strength & Muscle Tone: within normal limits Gait & Station: normal Patient leans: N/A  There were no vitals taken for this visit.  Recommendations: Based on my evaluation the patient does not appear to  have an emergency medical condition.   Patient endorses SI. He denies plan or intent although he is unable to contract for safety. He denied. He reports of AVH does not appear to be a true psychosis. There are no signs that he is internally preoccupied. Because he is not able to contract for safety, I am reccommending over night oversedations. Patient and father agrees to this plan.  Patient will be transported to the [facility] for overnight safety and stability. He will be reassessed by psychiatry in the morning. Patient interested in outpatient therapy and we discussed the [facility] which he can be linked to as he become stable for discharge from the [facility]. Patient to be transported by safe transport.   Denzil Magnuson, NP 02/15/2020, 12:08 PM

## 2020-02-15 NOTE — Progress Notes (Signed)
Skin assessment completed - recent superficial cuts on right forearm which occurred yesterday. He has colorful tattos on his left and right upper arm and right wrist. Another tattoo on his left lateral side.

## 2020-02-15 NOTE — ED Provider Notes (Signed)
Behavioral Health Admission H&P [facility] & [facility])  Date: 02/15/20 Patient Name: Ronald Stanley MRN: [id number] Chief Complaint: No chief complaint on file.     Diagnoses:  Final diagnoses:  Bipolar I disorder, most recent episode depressed (HCC)    HPI: Patient is a 24 year old male that presented to [facility] as a walk-in reporting suicidal ideations and worsening depressive symptoms.  Patient has a history of bipolar 1 disorder.  Patient had reported that he had been on modafinil 100 mg daily to help him stay awake due to Zyprexa being sedating and he has been on it for approximately 4 to 6 months.  Patient also reports Zyprexa 10 mg nightly, Latuda 40 mg at lunchtime with food, and Remeron 15-30 mg as needed to assist with sleep.  He also reports the use of cocaine and marijuana.  He states his last use of cocaine was Monday.  He reports that his grandfather had passed away recently and his symptoms have been worsening and having auditory hallucinations.  He states that he Latuda was increased approximately 1-1/2 weeks ago but has not noticed a difference with his hallucinations.  He states that his psychiatrist had told him that they would look at increasing his Zyprexa to 15 or 20 mg at bedtime if the Latuda did not help him.  Patient was in agreement with increasing his Zyprexa to 15 mg tonight.At this time will hold Modafinil and will restart Remeron 15 mg QHS, increase Zyprexa 15 mg QHS, and hold Latuda 40 mg at this time. Concern for tremors and multiple antipsychotics being used in same category. Will give one time dose of Zyprexa 2.5 mg now. Discussed with patient and he stets understanding and agreement and also voiced concerns for multiple antipsychotic use. Patient is reporting some tremors  PHQ 2-9:     Ad[facility] (Discharged[facility]  C-SSRS RISK CATEGORY Low Risk       Total Time spent with patient: 30  minutes  Musculoskeletal  Strength & Muscle Tone: within normal limits Gait & Station: normal Patient leans: N/A  Psychiatric Specialty Exam  Presentation General Appearance: Casual;Fairly Groomed  Eye Contact:Good  Speech:Clear and Coherent;Normal Rate  Speech Volume:Normal  Handedness:Right   Mood and Affect  Mood:Anxious;Depressed  Affect:Appropriate;Congruent   Thought Process  Thought Processes:Coherent  Descriptions of Associations:Intact  Orientation:Full (Time, Place and Person)  Thought Content:WDL  Hallucinations:Hallucinations: Auditory  Ideas of Reference:None  Suicidal Thoughts:Suicidal Thoughts: Yes, Passive  Homicidal Thoughts:Homicidal Thoughts: No   Sensorium  Memory:Immediate Good;Recent Good;Remote Good  Judgment:Good  Insight:Good   Executive Functions  Concentration:Good  Attention Span:Good  Recall:Good  Fund of Knowledge:Good  Language:Good   Psychomotor Activity  Psychomotor Activity:Psychomotor Activity: Normal   Assets  Assets:Communication Skills;Desire for Improvement;Financial Resources/Insurance;Housing;Social Support;Transportation;Physical Health   Sleep  Sleep:Sleep: Fair   Physical Exam Vitals and nursing note reviewed.  Constitutional:      Appearance: He is well-developed.  Cardiovascular:     Rate and Rhythm: Normal rate.  Pulmonary:     Effort: Pulmonary effort is normal.  Musculoskeletal:        General: Normal range of motion.  Skin:    General: Skin is warm.  Neurological:     Mental Status: He is alert and oriented to person, place, and time.    Review of Systems  Constitutional: Negative.   HENT: Negative.   Eyes: Negative.   Respiratory: Negative.   Cardiovascular: Negative.  Gastrointestinal: Negative.   Genitourinary: Negative.   Musculoskeletal: Negative.   Skin: Negative.   Neurological: Negative.   Endo/Heme/Allergies: Negative.   Psychiatric/Behavioral: Positive  for depression, hallucinations, substance abuse and suicidal ideas. The patient is nervous/anxious.     Blood pressure (!) 138/89, temperature 97.8 F (36.6 C), temperature source Oral, resp. rate 18, height 6\' 1"  (1.854 m), weight 140 lb (63.5 kg). Body mass index is 18.47 kg/m.  Past Psychiatric History: Bipolar I disorder, self-injurious behavior   Is the patient at risk to self? Yes  Has the patient been a risk to self in the past 6 months? No .    Has the patient been a risk to self within the distant past? No   Is the patient a risk to others? No   Has the patient been a risk to others in the past 6 months? No   Has the patient been a risk to others within the distant past? No   Past Medical History:  Past Medical History:  Diagnosis Date  . Depression     Past Surgical History:  Procedure Laterality Date  . APPENDECTOMY    . HERNIA REPAIR    . MYRINGOTOMY WITH TUBE PLACEMENT      Family History:  Family History  Problem Relation Age of Onset  . Hyperlipidemia Father   . Hypertension Father   . Depression Mother     Social History:  Social History   Socioeconomic History  . Marital status: Single    Spouse name: Not on file  . Number of children: Not on file  . Years of education: Not on file  . Highest education level: Not on file  Occupational History  . Occupation: [employer]  Tobacco Use  . Smoking status: Never Smoker  . Smokeless tobacco: Never Used  Vaping Use  . Vaping Use: Some days  . Substances: Nicotine  Substance and Sexual Activity  . Alcohol use: No  . Drug use: Yes    Frequency: 1.0 times per week    Types: Marijuana    Comment: pt reported that she take a couple pulls a week to help him sleep  . Sexual activity: Not Currently  Other Topics Concern  . Not on file  Social History Narrative  . Not on file   Social Determinants of Health   Financial Resource Strain:   . Difficulty of Paying Living Expenses:   Food  Insecurity:   . Worried About Cecille [place] in the Last Year:   . Programme researcher, broadcasting/film/video in the Last Year:   Transportation Needs:   . Barista (Medical):   Freight forwarder Lack of Transportation (Non-Medical):   Physical Activity:   . Days of Exercise per Week:   . Minutes of Exercise per Session:   Stress:   . Feeling of Stress :   Social Connections:   . Frequency of Communication with Friends and Family:   . Frequency of Social Gatherings with Friends and Family:   . Attends Religious Services:   . Active Member of Clubs or Organizations:   . Attends Marland Kitchen Meetings:   Banker Marital Status:   Intimate Partner Violence:   . Fear of Current or Ex-Partner:   . Emotionally Abused:   Marland Kitchen Physically Abused:   . Sexually Abused:     SDOH:  SDOH Screenings   Alcohol Screen:   . Last Alcohol Screening Score (AUDIT):   Depression (PHQ2-9):   .  PHQ-2 Score:   Financial Resource Strain:   . Difficulty of Paying Living Expenses:   Food Insecurity:   . Worried About Programme researcher, broadcasting/film/video in the Last Year:   . [employer] of Food in the Last Year:   Housing:   . Last Housing Risk Score:   Physical Activity:   . Days of Exercise per Week:   . Minutes of Exercise per Session:   Social Connections:   . Frequency of Communication with Friends and Family:   . Frequency of Social Gatherings with Friends and Family:   . Attends Religious Services:   . Active Member of Clubs or Organizations:   . Attends Banker Meetings:   Marland Kitchen Marital Status:   Stress:   . Feeling of Stress :   Tobacco Use: Low Risk   . Smoking Tobacco Use: Never Smoker  . Smokeless Tobacco Use: Never Used  Transportation Needs:   . Freight forwarder (Medical):   Marland Kitchen Lack of Transportation (Non-Medical):     Last Labs:  No visits with results within 6 Month(s) from this visit.  Latest known visit with results is:  Admission on 06/10/2019, Discharged on 06/10/2019  Component Date Value Ref  Range Status  . SARS Coronavirus 2 Ag 06/10/2019 Negative  Negative Final  . SARS CoV2 RNA 06/10/2019 Not Detected  Not Detect Final   Comment: A Not Detected (negative) test result for this test means  that SARS- CoV-2 RNA was not present in the specimen above  the limit of detection. A negative result does not rule out  the possibility of COVID-19 and should not be used as the  sole basis for treatment or patient management decisions. If  COVID-19 is still suspected, based on exposure history   together with other clinical findings, re-testing should be  considered in consultation with public health authorities.  Laboratory test results should always be considered in the  context of clinical observations and epidemiological data in  making a final diagnosis and patient management decisions.  (Note) . Please review the Fact Sheets and FDA authorized labeling available  for health care providers and patients using the following websites:  https://www.[email or web address]/home/Covid-[email or web address]/fact-shee t.html https://www.[email or web address]/home/Covid-19/Patients/[facility]/fact -sheet.html . This test                           has been authorized by the FDA under an Emergency Use  Authorization (EUA) for use by authorized laboratories. . Due to the current public health emergency, [facility] is  receiving a high volume of samples from a wide variety of swabs and  media for COVID-19 testing. In order to serve patients during this  public health crisis, samples from appropriate clinical sources are  being tested. Negative test results derived from specimens received  in non-commercially manufactured viral collection and transport  media, or in media and sample collection kits not yet authorized by  FDA for COVID-19 testing should be cautiously evaluated and the  patient potentially subjected to extra precautions such as additional  clinical monitoring, including  collection of an additional specimen.  . Methodology:  Nucleic Acid Amplification Test (NAAT) includes RT-PCR  or TMA . Please review the Fact Sheets for health care providers, and  patients and the FDA authorized labeling avai                          lable on the [facility]  website: www.[facility].com/Covid19 .  [facility] [address] [place] [address] [phone] Eulah Pont, MD     Allergies: Patient has no known allergies.  PTA Medications: (Not in a hospital admission)   Medical Decision Making  Covid tests and labs ordered. No reported or apparent medical concerns. VS are WNL except BP at 139/89    Recommendations  Based on my evaluation the patient does not appear to have an emergency medical condition.  Gerlene Burdock Rolan Wrightsman, FNP 02/15/20  2:16 PM

## 2020-02-15 NOTE — ED Notes (Signed)
Patient read a book for a short time. Patient then left for lab work.

## 2020-02-15 NOTE — BH Assessment (Signed)
Assessment Note  Ronald Stanley is an 24 y.o. male who presented to [facility] with his father.  Patient states that he has been diagnosed with bipolar disorder and states that he was a patient at [facility] in 2019.  Patient states that he sees a psychiatrist Human resources officer) on an outpatient basis for medication management.  Patient states that his grandfather died two weeks ago and he has been on a downward spiral.  Patient has self-mutilated by cutting superficially and he last cut himself last night.  He states that he has been snorting 5 lines of cocaine daily over the past two weeks.  Patient states that he normally smokes less than one gram of marijuana daily.  Patient states that he has not been sleeping well averaging only six hours per night and he states that he has not been eating and he states that he has lost ten pounds.  Patient states that he has been having suicidal thoughts, but denies any particular plan.  He states that he has attempted suicide by cutting on one to two occasions in the past.  He denies HI, but states that he hears static and people calling his name. Patient denies any history of abuse.  Patient states that he is not currently attending school, but states that he would like to make plans to go to college one day.  Patient states that he is employed by the [employer].  Patient states that job stress also contributes to his depression.  Patient is alert and oriented, his mood depressed and his affect flat.  He states that he has been hearing voices.  His thoughts are organized and his memory intact.  His judgment, insight and impulse control are impaired.     Diagnosis:  F31.4 Bipolar Disorder Depressed  Past Medical History:  Past Medical History:  Diagnosis Date  . Depression     Past Surgical History:  Procedure Laterality Date  . APPENDECTOMY    . HERNIA REPAIR    . MYRINGOTOMY WITH TUBE PLACEMENT      Family History:  Family History  Problem Relation Age of  Onset  . Hyperlipidemia Father   . Hypertension Father   . Depression Mother     Social History:  reports that he has never smoked. He has never used smokeless tobacco. He reports current drug use. Frequency: 1.00 time per week. Drug: Marijuana. He reports that he does not drink alcohol.  Additional Social History:  Alcohol / Drug Use Pain Medications: See MAR Prescriptions: See MAR Over the Counter: See MAR History of alcohol / drug use?: Yes Longest period of sobriety (when/how long): none reported Negative Consequences of Use: Financial, Personal relationships Substance #1 Name of Substance 1: marijuana 1 - Age of First Use: 17 1 - Amount (size/oz): <1 gram daily 1 - Frequency: daily 1 - Duration: since onset 1 - Last Use / Amount: last pm Substance #2 Name of Substance 2: cocaine 2 - Age of First Use: 17 2 - Amount (size/oz): 5 lines 2 - Frequency: daily x 2 weeks 2 - Duration: had not used prior to two weeks ago in a long time 2 - Last Use / Amount: last pm  CIWA:   COWS:    Allergies: No Known Allergies  Home Medications: (Not in a hospital admission)   OB/GYN Status:  No LMP for male patient.  General Assessment Data Location of Assessment: [facility] Assessment Services TTS Assessment: In system Is this a Tele or Face-to-Face Assessment?:  Face-to-Face Is this an Initial Assessment or a Re-assessment for this encounter?: Initial Assessment Patient Accompanied by:: Parent Language Other than English: No Living Arrangements: Other (Comment) (with parents) What gender do you identify as?: Male Date Telepsych consult ordered in [facility]: 02/15/20 Time Telepsych consult ordered in [facility]: 1100 Marital status: Single Living Arrangements: Parent Can pt return to current living arrangement?: Yes Admission Status: Voluntary Is patient capable of signing voluntary admission?: Yes Referral Source: Self/Family/Friend Insurance type: BCBS     Crisis Care Plan Living  Arrangements: Parent Legal Guardian: Other: (self) Name of Psychiatrist: Josephine Cables Name of Therapist: none  Education Status Is patient currently in school?: No Is the patient employed, unemployed or receiving disability?: Employed  Risk to self with the past 6 months Suicidal Ideation: Yes-Currently Present Has patient been a risk to self within the past 6 months prior to admission? : No Suicidal Intent: No Has patient had any suicidal intent within the past 6 months prior to admission? : No Is patient at risk for suicide?: Yes Suicidal Plan?: No Has patient had any suicidal plan within the past 6 months prior to admission? : No Access to Means: No What has been your use of drugs/alcohol within the last 12 months?: marijuana and cocaine daily Previous Attempts/Gestures:  (1-2 times in the past) Other Self Harm Risks:  (recently loss of grandfather / drug use) Triggers for Past Attempts: None known Intentional Self Injurious Behavior: Cutting Comment - Self Injurious Behavior: states that he cut last night Family Suicide History: No Recent stressful life event(s): Loss (Comment) (grandfather dies) Persecutory voices/beliefs?: No Depression: Yes Depression Symptoms: Insomnia, Isolating, Fatigue, Loss of interest in usual pleasures Substance abuse history and/or treatment for substance abuse?: Yes Suicide prevention information given to non-admitted patients: Not applicable  Risk to Others within the past 6 months Homicidal Ideation: No Does patient have any lifetime risk of violence toward others beyond the six months prior to admission? : No Thoughts of Harm to Others: No Current Homicidal Intent: No Current Homicidal Plan: No Access to Homicidal Means: No Identified Victim: none History of harm to others?: No Assessment of Violence: None Noted Violent Behavior Description: none Does patient have access to weapons?: No Criminal Charges Pending?: No Does patient have a  court date: No Is patient on probation?: No  Psychosis Hallucinations: Auditory (hears static and people calling his name) Delusions: None noted  Mental Status Report Appearance/Hygiene: Unremarkable Eye Contact: Good Motor Activity: Freedom of movement Speech: Logical/coherent Level of Consciousness: Alert Mood: Depressed Affect: Appropriate to circumstance Anxiety Level: Moderate Thought Processes: Coherent, Relevant Judgement: Impaired Orientation: Person, Place, Time, Situation Obsessive Compulsive Thoughts/Behaviors: None  Cognitive Functioning Concentration: Normal Memory: Recent Intact, Remote Intact Is patient IDD: No Insight: Poor Impulse Control: Poor Appetite: Poor Have you had any weight changes? : Loss Amount of the weight change? (lbs): 10 lbs Sleep: Decreased Total Hours of Sleep: 6 Vegetative Symptoms: None  ADLScreening [facility] Assessment Services) Patient's cognitive ability adequate to safely complete daily activities?: Yes Patient able to express need for assistance with ADLs?: No Independently performs ADLs?: Yes (appropriate for developmental age)  Prior Inpatient Therapy Prior Inpatient Therapy: Yes Prior Therapy Dates: 06/2018 Prior Therapy Facilty/Provider(s): [facility] Reason for Treatment: depression  Prior Outpatient Therapy Prior Outpatient Therapy: Yes Prior Therapy Dates: active Prior Therapy Facilty/Provider(s): [facility] Reason for Treatment: med management Does patient have an ACCT team?: No Does patient have Intensive In-House Services?  : No Does patient have [facility] services? :  No Does patient have P4CC services?: No  ADL Screening (condition at time of admission) Patient's cognitive ability adequate to safely complete daily activities?: Yes Is the patient deaf or have difficulty hearing?: No Does the patient have difficulty seeing, even when wearing glasses/contacts?: No Does the patient have difficulty concentrating,  remembering, or making decisions?: No Patient able to express need for assistance with ADLs?: No Does the patient have difficulty dressing or bathing?: No Independently performs ADLs?: Yes (appropriate for developmental age) Does the patient have difficulty walking or climbing stairs?: No Weakness of Legs: None Weakness of Arms/Hands: None  Home Assistive Devices/Equipment Home Assistive Devices/Equipment: None  Therapy Consults (therapy consults require a physician order) PT Evaluation Needed: No OT Evalulation Needed: No SLP Evaluation Needed: No Abuse/Neglect Assessment (Assessment to be complete while patient is alone) Abuse/Neglect Assessment Can Be Completed: Yes Physical Abuse: Denies Verbal Abuse: Denies Sexual Abuse: Denies Exploitation of patient/patient's resources: Denies Self-Neglect: Denies Values / Beliefs Cultural Requests During Hospitalization: None Spiritual Requests During Hospitalization: None Consults Spiritual Care Consult Needed: No Transition of Care Team Consult Needed: No Advance Directives (For Healthcare) Does Patient Have a Medical Advance Directive?: No Would patient like information on creating a medical advance directive?: No - Patient declined Nutrition Screen- [facility] Adult/WL/AP Has the patient recently lost weight without trying?: Yes, 2-13 lbs. Has the patient been eating poorly because of a decreased appetite?: Yes Malnutrition Screening Tool Score: 2        Disposition: Per Denzil Magnuson, NP, patient will be admitted to the [facility] overnight to monitor for safety and stability and will be re-evaluated in the morning  Disposition Initial Assessment Completed for this Encounter: Yes Disposition of Patient:  (Overnight OBS at [facility])  On Site Evaluation by:   Reviewed with Physician:    Arnoldo Lenis Latecia Miler 02/15/2020 12:26 PM

## 2020-02-16 DIAGNOSIS — F3130 Bipolar disorder, current episode depressed, mild or moderate severity, unspecified: Secondary | ICD-10-CM | POA: Diagnosis not present

## 2020-02-16 MED ORDER — OLANZAPINE 15 MG PO TABS: 15.0000 mg | ORAL_TABLET | Freq: Every day | ORAL | 0 refills | Status: DC

## 2020-02-16 NOTE — Discharge Instructions (Addendum)
Keep scheduled appointments with [facility] Stop use of illicit substances Stop Latuda and Modafinil Start Zyprexa 15 mg by mouth at bedtime

## 2020-02-16 NOTE — ED Provider Notes (Signed)
FBC/OBS ASAP Discharge Summary  Date and Time: 02/16/2020 7:45 AM  Name: Ronald Stanley  MRN:  [id number]   Discharge Diagnoses:  Final diagnoses:  Bipolar I disorder, most recent episode depressed (HCC)    Subjective: Patient is seen this morning and he states that he is feeling better.  Patient states that he slept very well last night and that he is not having any hallucinations.  Patient denies any suicidal or homicidal ideations and states that he feels safe to discharge home with his father.  Patient states that he agrees with medication changes that have been made and that if he feels that this will be a good start for him.  He reports that he has a follow-up appointment with [facility] in about 2 weeks.  He states agreement and understanding will discontinue use of his modafinil and his Latuda.  He also states understanding and agreement to increase his Zyprexa to 15 mg at bedtime and to take his Remeron 15 mg at bedtime on a scheduled daily basis.  Stay Summary: Patient is a 24 year old male that presented to the [facility] as a walk-in reporting suicidal ideations with worsening depressive symptoms and some hallucinations.  Patient has a history of bipolar 1 disorder.  After reviewing patient's medications it was reported that he is taking modafinil 100 mg p.o. daily, Zyprexa 10 mg nightly, Latuda 40 mg at lunchtime, and Remeron 15 to 30 mg as needed for sleep.  After consulting with Dr. Lucianne Muss and reviewing the chart decided to discontinue modafinil due to the patient reporting hallucinations and worsening symptoms and having some tremors.  Also decided to discontinue use of Latuda as patient stated that there was no improvement with increased doses of Latuda.  Due to increased dose of Zyprexa to 15 mg at bedtime and scheduled Remeron 15 mg at bedtime.  Patient reported today feeling much better and not having any hallucinations.  He denied any suicidal homicidal ideations and denies any hallucinations.   He reports that he lives with his father and [facility]  At this time patient does not meet any inpatient psychiatric treatment criteria and is psychiatric cleared.  Patient states he will contact his father for transportation home.  Prescription for his Zyprexa 15 mg nightly has been E prescribed to pharmacy of choice.  Total Time spent with patient: 30 minutes  Past Psychiatric History: Bipolar I disorder, self-injurious behavior  Past Medical History:  Past Medical History:  Diagnosis Date  . Depression     Past Surgical History:  Procedure Laterality Date  . APPENDECTOMY    . HERNIA REPAIR    . MYRINGOTOMY WITH TUBE PLACEMENT     Family History:  Family History  Problem Relation Age of Onset  . Hyperlipidemia Father   . Hypertension Father   . Depression Mother    Family Psychiatric History: mother-depression Social History:  Social History   Substance and Sexual Activity  Alcohol Use No     Social History   Substance and Sexual Activity  Drug Use Yes  . Frequency: 1.0 times per week  . Types: Marijuana   Comment: pt reported that she take a couple pulls a week to help him sleep    Social History   Socioeconomic History  . Marital status: Single    Spouse name: Not on file  . Number of children: Not on file  . Years of education: Not on file  . Highest education level: Not on  file  Occupational History  . Occupation: [employer]  Tobacco Use  . Smoking status: Never Smoker  . Smokeless tobacco: Never Used  Vaping Use  . Vaping Use: Some days  . Substances: Nicotine  Substance and Sexual Activity  . Alcohol use: No  . Drug use: Yes    Frequency: 1.0 times per week    Types: Marijuana    Comment: pt reported that she take a couple pulls a week to help him sleep  . Sexual activity: Not Currently  Other Topics Concern  . Not on file  Social History Narrative  . Not on file   Social Determinants of  Health   Financial Resource Strain:   . Difficulty of Paying Living Expenses:   Food Insecurity:   . Worried About Programme researcher, broadcasting/film/video in the Last Year:   . Barista in the Last Year:   Transportation Needs:   . Freight forwarder (Medical):   Marland Kitchen Lack of Transportation (Non-Medical):   Physical Activity:   . Days of Exercise per Week:   . Minutes of Exercise per Session:   Stress:   . Feeling of Stress :   Social Connections:   . Frequency of Communication with Friends and Family:   . Frequency of Social Gatherings with Friends and Family:   . Attends Religious Services:   . Active Member of Clubs or Organizations:   . Attends Banker Meetings:   Marland Kitchen Marital Status:    SDOH:  SDOH Screenings   Alcohol Screen:   . Last Alcohol Screening Score (AUDIT):   Depression (PHQ2-9):   . PHQ-2 Score:   Financial Resource Strain:   . Difficulty of Paying Living Expenses:   Food Insecurity:   . Worried About Programme researcher, broadcasting/film/video in the Last Year:   . [employer] of Food in the Last Year:   Housing:   . Last Housing Risk Score:   Physical Activity:   . Days of Exercise per Week:   . Minutes of Exercise per Session:   Social Connections:   . Frequency of Communication with Friends and Family:   . Frequency of Social Gatherings with Friends and Family:   . Attends Religious Services:   . Active Member of Clubs or Organizations:   . Attends Banker Meetings:   Marland Kitchen Marital Status:   Stress:   . Feeling of Stress :   Tobacco Use: Low Risk   . Smoking Tobacco Use: Never Smoker  . Smokeless Tobacco Use: Never Used  Transportation Needs:   . Freight forwarder (Medical):   Marland Kitchen Lack of Transportation (Non-Medical):     Has this patient used any form of tobacco in the last 30 days? (Cigarettes, Smokeless Tobacco, Cigars, and/or Pipes) Prescription not provided because: doesn't smoke  Current Medications:  Current Facility-Administered Medications   Medication Dose Route Frequency Provider Last Rate Last Admin  . acetaminophen (TYLENOL) tablet 650 mg  650 mg Oral Q6H PRN Ranard Harte, Gerlene Burdock, FNP      . alum & mag hydroxide-simeth (MAALOX/MYLANTA) 200-200-20 MG/5ML suspension 30 mL  30 mL Oral Q4H PRN Quasim Doyon, Gerlene Burdock, FNP      . hydrOXYzine (ATARAX/VISTARIL) tablet 25 mg  25 mg Oral TID PRN Willer Osorno, Gerlene Burdock, FNP      . magnesium hydroxide (MILK OF MAGNESIA) suspension 30 mL  30 mL Oral Daily PRN Camaria Gerald, Gerlene Burdock, FNP      . mirtazapine (  REMERON) tablet 15 mg  15 mg Oral QHS Lauralye Kinn, Gerlene Burdock, FNP   15 mg at 02/15/20 2100  . OLANZapine (ZYPREXA) tablet 15 mg  15 mg Oral QHS Araly Kaas, Gerlene Burdock, FNP   15 mg at 02/15/20 2100   Current Outpatient Medications  Medication Sig Dispense Refill  . LATUDA 40 MG TABS tablet Take 40 mg by mouth at bedtime.     . mirtazapine (REMERON) 15 MG tablet Take 15 mg by mouth at bedtime.    . modafinil (PROVIGIL) 100 MG tablet Take 100 mg by mouth daily.    Marland Kitchen OLANZapine (ZYPREXA) 10 MG tablet Take 10 mg by mouth at bedtime.    . cephALEXin (KEFLEX) 500 MG capsule Take 1 capsule (500 mg total) by mouth 2 (two) times daily. (Patient not taking: Reported on 02/15/2020) 14 capsule 0  . ondansetron (ZOFRAN ODT) 4 MG disintegrating tablet Take one tab by mouth Q6hr prn nausea.  Dissolve under tongue. (Patient not taking: Reported on 02/15/2020) 12 tablet 0    PTA Medications: (Not in a hospital admission)   Musculoskeletal  Strength & Muscle Tone: within normal limits Gait & Station: normal Patient leans: N/A  Psychiatric Specialty Exam  Presentation  General Appearance: Appropriate for Environment;Casual  Eye Contact:Good  Speech:Clear and Coherent;Normal Rate  Speech Volume:Normal  Handedness:Right   Mood and Affect  Mood:Euthymic  Affect:Congruent   Thought Process  Thought Processes:Coherent  Descriptions of Associations:Intact  Orientation:Full (Time, Place and Person)  Thought  Content:WDL  Hallucinations:Hallucinations: None  Ideas of Reference:None  Suicidal Thoughts:Suicidal Thoughts: No  Homicidal Thoughts:Homicidal Thoughts: No   Sensorium  Memory:Immediate Good;Recent Good;Remote Good  Judgment:Good  Insight:Good   Executive Functions  Concentration:Good  Attention Span:Good  Recall:Good  Fund of Knowledge:Good  Language:Good   Psychomotor Activity  Psychomotor Activity:Psychomotor Activity: Normal   Assets  Assets:Communication Skills;Desire for Improvement;Financial Resources/Insurance;Housing;Physical Health;Social Support;Transportation   Sleep  Sleep:Sleep: Good   Physical Exam  Physical Exam ROS Blood pressure (!) 138/89, temperature 97.8 F (36.6 C), temperature source Oral, resp. rate 18, height 6\' 1"  (1.854 m), weight 140 lb (63.5 kg). Body mass index is 18.47 kg/m.  Demographic Factors:  Male, Adolescent or young adult and Caucasian  Loss Factors: NA  Historical Factors: Family history of mental illness or substance abuse  Risk Reduction Factors:   Living with another person, especially a relative, Positive social support, Positive therapeutic relationship and Positive coping skills or problem solving skills  Continued Clinical Symptoms:  Alcohol/Substance Abuse/Dependencies  Cognitive Features That Contribute To Risk:  None    Suicide Risk:  Minimal: No identifiable suicidal ideation.  Patients presenting with no risk factors but with morbid ruminations; may be classified as minimal risk based on the severity of the depressive symptoms  Plan Of Care/Follow-up recommendations:  Continue activity as tolerated. Continue diet as recommended by your PCP. Ensure to keep all appointments with outpatient providers.  Disposition: Discharge home with father. Follow up with [facility]  , FNP 02/16/2020, 7:45 AM

## 2020-02-16 NOTE — ED Notes (Signed)
Pt discharged in no acute distress. Denies SI at present. Pt escorted to lobby via staff assist to father for transport to home. Safety maintained. Verbalized understanding of all discharge instructions.

## 2020-02-16 NOTE — ED Notes (Signed)
Pt asleep in no acute distress. Easily aroused. Denies SI/HI currently. Pt refused bkft. Safety maintained.

## 2020-03-15 DIAGNOSIS — Z20822 Contact with and (suspected) exposure to covid-19: Secondary | ICD-10-CM | POA: Diagnosis not present

## 2020-03-15 DIAGNOSIS — Z03818 Encounter for observation for suspected exposure to other biological agents ruled out: Secondary | ICD-10-CM | POA: Diagnosis not present

## 2020-03-18 DIAGNOSIS — F3132 Bipolar disorder, current episode depressed, moderate: Secondary | ICD-10-CM | POA: Diagnosis not present

## 2020-03-18 DIAGNOSIS — F1220 Cannabis dependence, uncomplicated: Secondary | ICD-10-CM | POA: Diagnosis not present

## 2020-03-18 DIAGNOSIS — F319 Bipolar disorder, unspecified: Secondary | ICD-10-CM | POA: Diagnosis not present

## 2020-03-19 DIAGNOSIS — F3132 Bipolar disorder, current episode depressed, moderate: Secondary | ICD-10-CM | POA: Diagnosis not present

## 2020-03-19 DIAGNOSIS — F319 Bipolar disorder, unspecified: Secondary | ICD-10-CM | POA: Diagnosis not present

## 2020-03-22 DIAGNOSIS — F3132 Bipolar disorder, current episode depressed, moderate: Secondary | ICD-10-CM | POA: Diagnosis not present

## 2020-03-25 DIAGNOSIS — Z0000 Encounter for general adult medical examination without abnormal findings: Secondary | ICD-10-CM | POA: Diagnosis not present

## 2020-03-25 DIAGNOSIS — F1220 Cannabis dependence, uncomplicated: Secondary | ICD-10-CM | POA: Diagnosis not present

## 2020-03-25 DIAGNOSIS — F17290 Nicotine dependence, other tobacco product, uncomplicated: Secondary | ICD-10-CM | POA: Diagnosis not present

## 2020-03-25 DIAGNOSIS — F319 Bipolar disorder, unspecified: Secondary | ICD-10-CM | POA: Diagnosis not present

## 2020-04-29 DIAGNOSIS — F1490 Cocaine use, unspecified, uncomplicated: Secondary | ICD-10-CM | POA: Diagnosis not present

## 2020-04-29 DIAGNOSIS — F3132 Bipolar disorder, current episode depressed, moderate: Secondary | ICD-10-CM | POA: Diagnosis not present

## 2020-04-30 IMAGING — DX DG FOOT COMPLETE 3+V*R*
3 series · 3 of 3 positions shown · non-contrast
Comparison: None.

CLINICAL DATA: Glass top desk fell on foot 1 week ago with
persistent pain, initial encounter

EXAM:
RIGHT FOOT COMPLETE - 3+ VIEW

[foot ap]
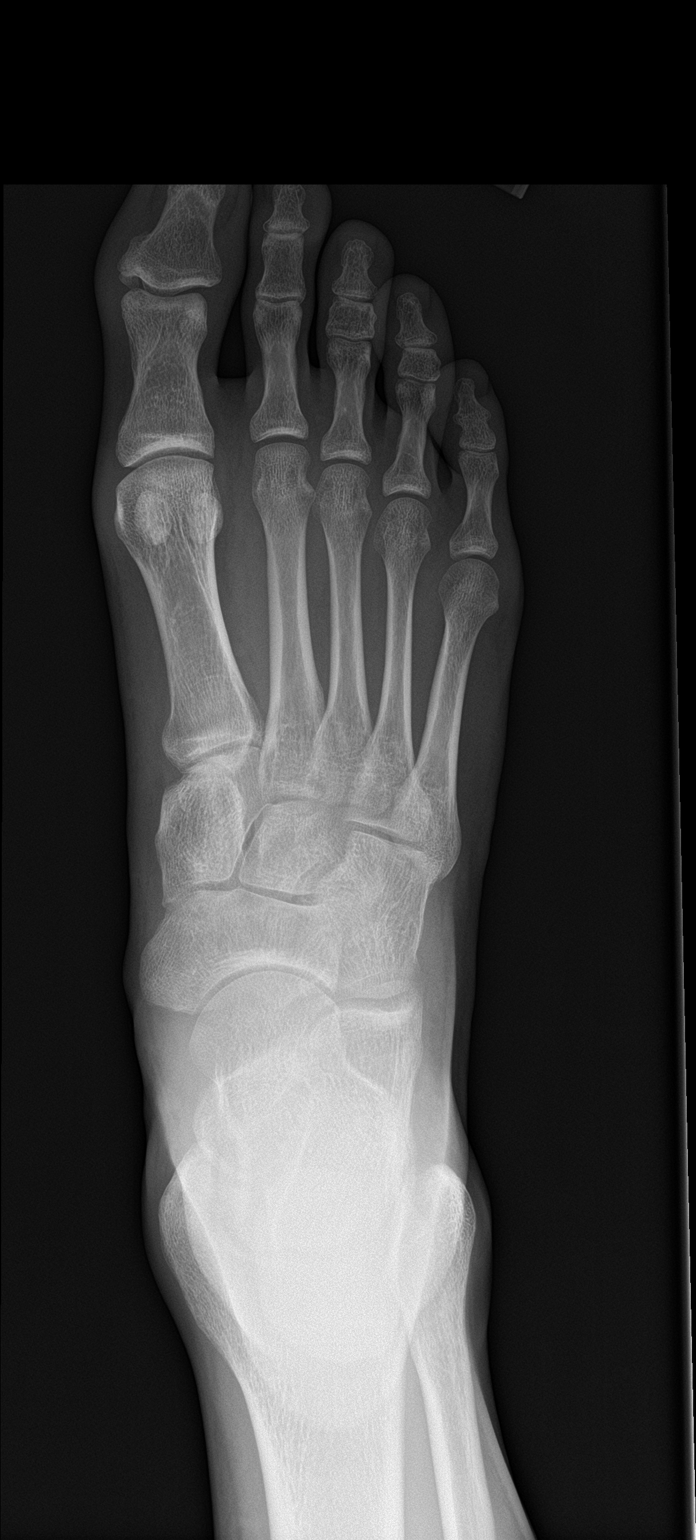

[foot obl]
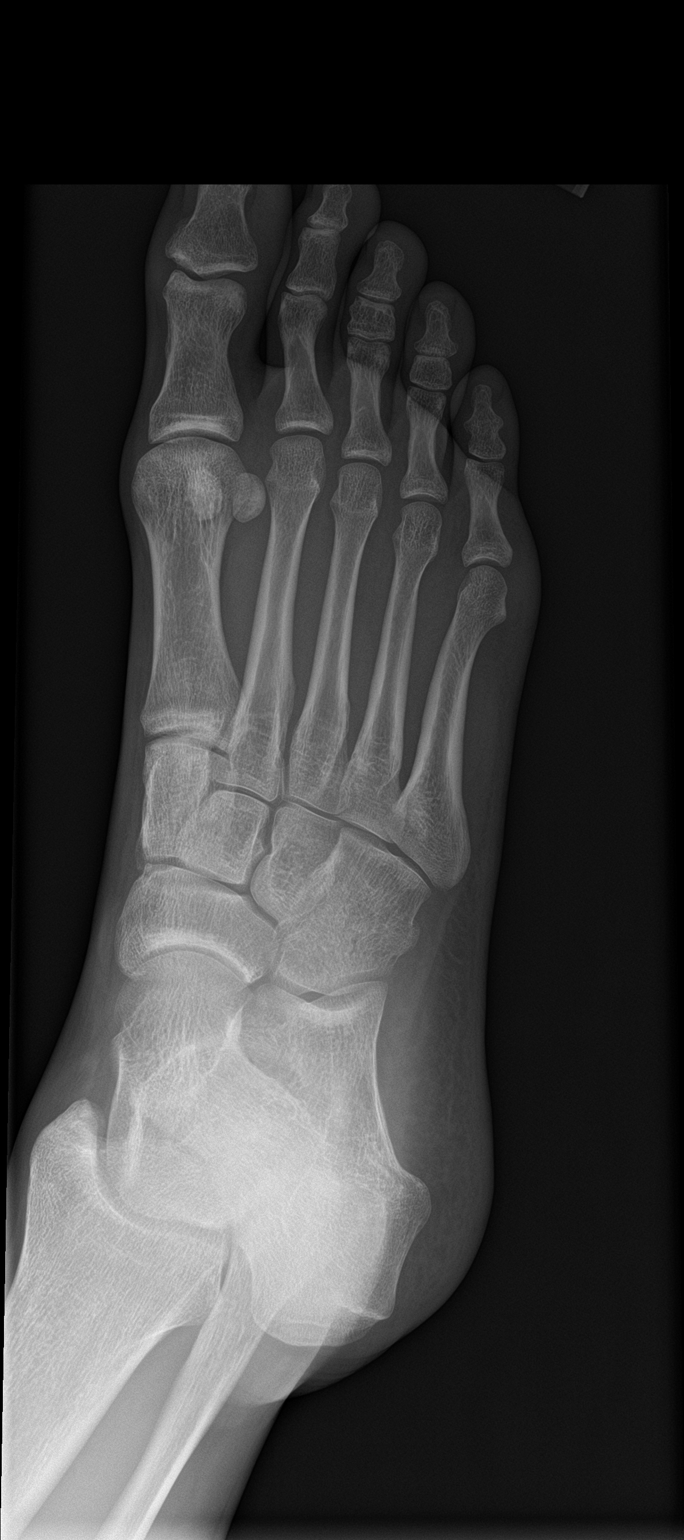

[foot lat]
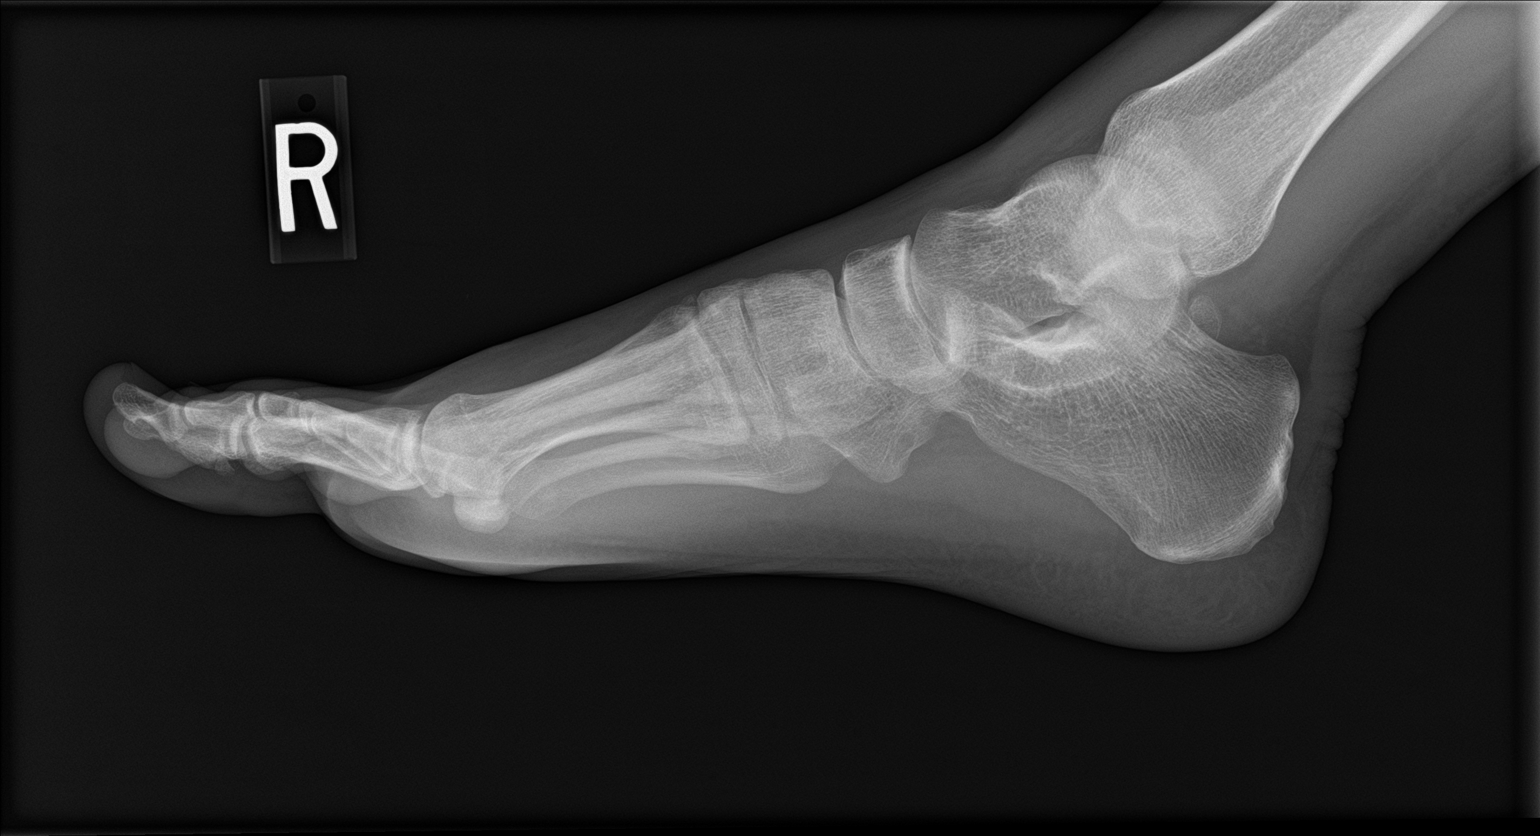

[3 of 3 positions shown; findings below may reference images not displayed]

FINDINGS: Mild soft tissue swelling is noted over the metatarsals. No acute
fracture or dislocation is noted. No radiopaque foreign body is seen
although glass shards can be radiolucent.
IMPRESSION: Soft tissue swelling without acute bony abnormality.

## 2020-05-01 DIAGNOSIS — F3132 Bipolar disorder, current episode depressed, moderate: Secondary | ICD-10-CM | POA: Diagnosis not present

## 2020-05-01 DIAGNOSIS — F1490 Cocaine use, unspecified, uncomplicated: Secondary | ICD-10-CM | POA: Diagnosis not present

## 2020-06-18 DIAGNOSIS — F250 Schizoaffective disorder, bipolar type: Secondary | ICD-10-CM | POA: Diagnosis not present

## 2020-06-18 DIAGNOSIS — F519 Sleep disorder not due to a substance or known physiological condition, unspecified: Secondary | ICD-10-CM | POA: Diagnosis not present

## 2020-07-09 DIAGNOSIS — F519 Sleep disorder not due to a substance or known physiological condition, unspecified: Secondary | ICD-10-CM | POA: Diagnosis not present

## 2020-07-09 DIAGNOSIS — F250 Schizoaffective disorder, bipolar type: Secondary | ICD-10-CM | POA: Diagnosis not present

## 2020-11-21 ENCOUNTER — Other Ambulatory Visit: Payer: Self-pay

## 2020-11-21 ENCOUNTER — Encounter: Payer: Self-pay | Admitting: Family Medicine

## 2020-11-21 ENCOUNTER — Emergency Department (INDEPENDENT_AMBULATORY_CARE_PROVIDER_SITE_OTHER)
Admission: EM | Admit: 2020-11-21 | Discharge: 2020-11-21 | Disposition: A | Discharge status: Home / Self Care | Payer: Managed Care, Other (non HMO) | Source: Home / Self Care | Attending: Family Medicine | Admitting: Family Medicine

## 2020-11-21 DIAGNOSIS — R112 Nausea with vomiting, unspecified: Secondary | ICD-10-CM | POA: Diagnosis not present

## 2020-11-21 DIAGNOSIS — R519 Headache, unspecified: Secondary | ICD-10-CM | POA: Diagnosis not present

## 2020-11-21 DIAGNOSIS — R194 Change in bowel habit: Secondary | ICD-10-CM | POA: Diagnosis not present

## 2020-11-21 LAB — POCT URINALYSIS DIP (MANUAL ENTRY)
Bilirubin, UA: NEGATIVE
Blood, UA: NEGATIVE
Glucose, UA: NEGATIVE mg/dL
Ketones, POC UA: NEGATIVE mg/dL
Leukocytes, UA: NEGATIVE
Nitrite, UA: NEGATIVE
Protein Ur, POC: NEGATIVE mg/dL
Spec Grav, UA: 1.02 (ref 1.010–1.025)
Urobilinogen, UA: 0.2 E.U./dL
pH, UA: 8.5 — AB (ref 5.0–8.0)

## 2020-11-21 MED ORDER — RIZATRIPTAN BENZOATE 10 MG PO TABS: 10.0000 mg | ORAL_TABLET | ORAL | 0 refills | Status: DC | PRN

## 2020-11-21 MED ORDER — ONDANSETRON HCL 4 MG PO TABS: 4.0000 mg | ORAL_TABLET | Freq: Three times a day (TID) | ORAL | 0 refills | Status: AC | PRN

## 2020-11-21 NOTE — ED Triage Notes (Signed)
Eoodmesis and green stools x 3 weeks, intermittently. Vomited today with red blood in it. Has not eaten today, usually vomits after eating also gets bad headaches and vomits.

## 2020-11-21 NOTE — ED Provider Notes (Signed)
[facility]    CSN: [id number] Arrival date & time: 11/21/20  1054      History   Chief Complaint Chief Complaint  Patient presents with  . Emesis    HPI Ronald Stanley is a 25 y.o. male.   HPI 25 year old male presents with headache, vomiting, and green stools for 3 weeks.  Patient reports headaches occur on a daily basis, typically bilateral temporal area and describes headache/migraine are throbbing in nature.  Reports his Mother and sister have been diagnosed with headache/migraine and both are medically managed for headache/migraine.  He reports OTC Excedrin (1-2) will usually resolve headache/migraine.  He reports 6-ounces of emesis daily for the past 3 weeks.  Reports vomiting can come on at any time and is not associated with headaches or changes in bowel habits.  Reports stools are green in color for the past 3 weeks.  Denies abdominal pain, fever, hematochezia, diarrhea, or melena.  Past Medical History:  Diagnosis Date  . Depression     Patient Active Problem List   Diagnosis Date Noted  . Bipolar I disorder, most recent episode depressed (HCC) 03/21/2018    Past Surgical History:  Procedure Laterality Date  . APPENDECTOMY    . HERNIA REPAIR    . MYRINGOTOMY WITH TUBE PLACEMENT         Home Medications    Prior to Admission medications   Medication Sig Start Date End Date Taking? Authorizing Provider  ondansetron (ZOFRAN) 4 MG tablet Take 1 tablet (4 mg total) by mouth every 8 (eight) hours as needed for up to 10 days for nausea or vomiting. 11/21/20 12/01/20 Yes Trevor Iha, FNP  risperiDONE (RISPERDAL) 0.25 MG tablet Take 0.25 mg by mouth at bedtime.   Yes [provider]  rizatriptan (MAXALT) 10 MG tablet Take 1 tablet (10 mg total) by mouth as needed for migraine. May repeat in 2 hours if needed 11/21/20  Yes Trevor Iha, FNP  vortioxetine HBr (TRINTELLIX) 10 MG TABS tablet Take 10 mg by mouth daily.   Yes [provider]   OLANZapine (ZYPREXA) 15 MG tablet Take 1 tablet (15 mg total) by mouth at bedtime. 02/16/20   Money, Gerlene Burdock, FNP    Family History Family History  Problem Relation Age of Onset  . Hyperlipidemia Father   . Hypertension Father   . Depression Mother     Social History Social History   Tobacco Use  . Smoking status: Never Smoker  . Smokeless tobacco: Never Used  Vaping Use  . Vaping Use: Every day  . Substances: Nicotine  Substance Use Topics  . Alcohol use: Yes  . Drug use: Yes    Frequency: 1.0 times per week    Types: Marijuana    Comment: pt reported that she take a couple pulls a week to help him sleep     Allergies   Patient has no known allergies.   Review of Systems Review of Systems  Constitutional: Negative.   HENT: Negative.   Eyes: Negative.   Respiratory: Negative.   Cardiovascular: Negative.   Gastrointestinal: Positive for vomiting.  Genitourinary: Negative.   Musculoskeletal: Negative.   Skin: Negative.   Neurological: Positive for headaches.     Physical Exam Triage Vital Signs ED Triage Vitals  Enc Vitals Group     BP      Pulse      Resp      Temp      Temp src  SpO2      Weight      Height      Head Circumference      Peak Flow      Pain Score      Pain Loc      Pain Edu?      Excl. in GC?    No data found.  Updated Vital Signs BP 105/71 (BP Location: Right Arm)   Pulse 71   Temp 98.5 F (36.9 C) (Oral)   Resp 18   Ht 6' (1.829 m)   Wt 155 lb (70.3 kg)   SpO2 98%   BMI 21.02 kg/m      Physical Exam Vitals and nursing note reviewed.  Constitutional:      Appearance: Normal appearance. He is normal weight.  HENT:     Head: Normocephalic and atraumatic.     Mouth/Throat:     Mouth: Mucous membranes are moist.     Pharynx: Oropharynx is clear. No oropharyngeal exudate or posterior oropharyngeal erythema.  Eyes:     Extraocular Movements: Extraocular movements intact.     Conjunctiva/sclera: Conjunctivae  normal.     Pupils: Pupils are equal, round, and reactive to light.  Cardiovascular:     Rate and Rhythm: Normal rate and regular rhythm.     Pulses: Normal pulses.     Heart sounds: Normal heart sounds.  Pulmonary:     Effort: Pulmonary effort is normal.     Breath sounds: Normal breath sounds.  Abdominal:     Comments: Soft, nontender, nondistended, NABS x4, no rebound tenderness, guarding, rigidity, palpable masses, or hepatosplenomegaly.  No CVA tenderness.  Musculoskeletal:     Cervical back: Normal range of motion and neck supple.  Skin:    General: Skin is warm and dry.  Neurological:     General: No focal deficit present.     Mental Status: He is alert and oriented to person, place, and time.  Psychiatric:        Mood and Affect: Mood normal.        Behavior: Behavior normal.        Thought Content: Thought content normal.      UC Treatments / Results  Labs (all labs ordered are listed, but only abnormal results are displayed) Labs Reviewed  POCT URINALYSIS DIP (MANUAL ENTRY) - Abnormal; Notable for the following components:      Result Value   pH, UA 8.5 (*)    All other components within normal limits  C DIFFICILE QUICK SCREEN W PCR REFLEX  STOOL CULTURE  CBC WITH DIFFERENTIAL/PLATELET  LIPASE  COMPLETE METABOLIC PANEL WITH GFR    EKG   Radiology No results found.  Procedures Procedures (including critical care time)  Medications Ordered in UC Medications - No data to display  Initial Impression / Assessment and Plan / UC Course  I have reviewed the triage vital signs and the nursing notes.  Pertinent labs & imaging results that were available during my care of the patient were reviewed by me and considered in my medical decision making (see chart for details).     MDM: 1.  Intractable vomiting with nausea, 2.  Intractable episodic headache, 3.  Change in bowel habits.  Patient established with [facility] today prior to discharge.   We will start trial of Maxalt for nonspecific migraine, Zofran daily, as needed for N/V, advised patient we will follow-up with lab results once received. Final Clinical Impressions(s) / UC Diagnoses  Final diagnoses:  Intractable vomiting with nausea, unspecified vomiting type  Intractable episodic headache, unspecified headache type  Change in bowel habits     Discharge Instructions     Advised patient to adhere to bland diet/brat diet for the next 3 days, increase daily hydration with Gatorade G2, water, or ginger ale; gradually returning to normal diet in 3 days as tolerated.  Advised patient we will follow-up with lab results once received.    ED Prescriptions    Medication Sig Dispense Auth. Provider   ondansetron (ZOFRAN) 4 MG tablet Take 1 tablet (4 mg total) by mouth every 8 (eight) hours as needed for up to 10 days for nausea or vomiting. 30 tablet Trevor Iha, FNP   rizatriptan (MAXALT) 10 MG tablet Take 1 tablet (10 mg total) by mouth as needed for migraine. May repeat in 2 hours if needed 10 tablet Trevor Iha, FNP     PDMP not reviewed this encounter.   Trevor Iha, FNP 11/21/20 1243

## 2020-11-21 NOTE — Discharge Instructions (Addendum)
Advised patient to adhere to bland diet/brat diet for the next 3 days, increase daily hydration with Gatorade G2, water, or ginger ale; gradually returning to normal diet in 3 days as tolerated.  Advised patient we will follow-up with lab results once received.

## 2020-11-22 LAB — COMPLETE METABOLIC PANEL WITH GFR
AG Ratio: 1.9 (calc) (ref 1.0–2.5)
ALT: 26 U/L (ref 9–46)
AST: 21 U/L (ref 10–40)
Albumin: 4.9 g/dL (ref 3.6–5.1)
Alkaline phosphatase (APISO): 65 U/L (ref 36–130)
BUN: 10 mg/dL (ref 7–25)
CO2: 24 mmol/L (ref 20–32)
Calcium: 10.1 mg/dL (ref 8.6–10.3)
Chloride: 105 mmol/L (ref 98–110)
Creat: 0.96 mg/dL (ref 0.60–1.35)
GFR, Est African American: 129 mL/min/{1.73_m2} (ref >=60)
GFR, Est Non African American: 111 mL/min/{1.73_m2} (ref >=60)
Globulin: 2.6 g/dL (calc) (ref 1.9–3.7)
Glucose, Bld: 83 mg/dL (ref 65–99)
Potassium: 4.5 mmol/L (ref 3.5–5.3)
Sodium: 140 mmol/L (ref 135–146)
Total Bilirubin: 0.5 mg/dL (ref 0.2–1.2)
Total Protein: 7.5 g/dL (ref 6.1–8.1)

## 2020-11-22 LAB — CBC WITH DIFFERENTIAL/PLATELET
Absolute Monocytes: 269 cells/uL (ref 200–950)
Basophils Absolute: 48 cells/uL (ref 0–200)
Basophils Relative: 1 %
Eosinophils Absolute: 62 cells/uL (ref 15–500)
Eosinophils Relative: 1.3 %
HCT: 46.1 % (ref 38.5–50.0)
Hemoglobin: 15.4 g/dL (ref 13.2–17.1)
Lymphs Abs: 1402 cells/uL (ref 850–3900)
MCH: 28.5 pg (ref 27.0–33.0)
MCHC: 33.4 g/dL (ref 32.0–36.0)
MCV: 85.2 fL (ref 80.0–100.0)
MPV: 10.8 fL (ref 7.5–12.5)
Monocytes Relative: 5.6 %
Neutro Abs: 3019 cells/uL (ref 1500–7800)
Neutrophils Relative %: 62.9 %
Platelets: 215 10*3/uL (ref 140–400)
RBC: 5.41 10*6/uL (ref 4.20–5.80)
RDW: 12.6 % (ref 11.0–15.0)
Total Lymphocyte: 29.2 %
WBC: 4.8 10*3/uL (ref 3.8–10.8)

## 2020-11-22 LAB — LIPASE: Lipase: 25 U/L (ref 7–60)

## 2020-12-03 ENCOUNTER — Other Ambulatory Visit: Payer: Self-pay

## 2020-12-03 ENCOUNTER — Encounter: Payer: Self-pay | Admitting: Medical-Surgical

## 2020-12-03 ENCOUNTER — Ambulatory Visit (INDEPENDENT_AMBULATORY_CARE_PROVIDER_SITE_OTHER): Payer: Managed Care, Other (non HMO) | Admitting: Medical-Surgical

## 2020-12-03 VITALS — BP 103/66 | HR 63 | Temp 98.0°F | Ht 72.25 in | Wt 154.3 lb

## 2020-12-03 DIAGNOSIS — R195 Other fecal abnormalities: Secondary | ICD-10-CM

## 2020-12-03 DIAGNOSIS — F3130 Bipolar disorder, current episode depressed, mild or moderate severity, unspecified: Secondary | ICD-10-CM

## 2020-12-03 DIAGNOSIS — Z114 Encounter for screening for human immunodeficiency virus [HIV]: Secondary | ICD-10-CM

## 2020-12-03 DIAGNOSIS — Z7689 Persons encountering health services in other specified circumstances: Secondary | ICD-10-CM | POA: Diagnosis not present

## 2020-12-03 DIAGNOSIS — Z23 Encounter for immunization: Secondary | ICD-10-CM | POA: Diagnosis not present

## 2020-12-03 DIAGNOSIS — Z1159 Encounter for screening for other viral diseases: Secondary | ICD-10-CM | POA: Diagnosis not present

## 2020-12-03 MED ORDER — ONDANSETRON 4 MG PO TBDP: 4.0000 mg | ORAL_TABLET | Freq: Three times a day (TID) | ORAL | 1 refills | Status: DC | PRN

## 2020-12-03 NOTE — Progress Notes (Signed)
New Patient Office Visit  Subjective:  Patient ID: Ronald Stanley, male    DOB: [birth year]  Age: 25 y.o. MRN: [id number]  CC:  Chief Complaint  Patient presents with  . Establish Care    HPI [name] presents to establish care.  Bipolar type I-currently managed by psychiatry.  Taking his prescribed medications as ordered, tolerating well without difficulty.  Feels his symptoms are fairly well controlled and are continuing to improve.  Recent urgent care visit for vomiting, headache, and stool changes.  Notes that his symptoms have improved greatly since his urgent care visit but he does still have intermittent loose green stools.  They did blood work at urgent care which showed no concerns but he did not drop off the stool specimen for further evaluation.  Endorses that he did see a little bit of bright red blood with vomiting but attributed that to forceful episodes he has been having.  Has been using Zofran to help with nausea and vomiting, taking it approximately once every 2 to 3 days.  He did have 1 episode of vomiting on Saturday but has had none since.  Able to eat and drink most foods and liquids without difficulty.  Past Medical History:  Diagnosis Date  . Depression     Past Surgical History:  Procedure Laterality Date  . APPENDECTOMY    . HERNIA REPAIR    . MYRINGOTOMY WITH TUBE PLACEMENT      Family History  Problem Relation Age of Onset  . Hyperlipidemia Father   . Hypertension Father   . Depression Mother     Social History   Socioeconomic History  . Marital status: Single    Spouse name: Not on file  . Number of children: Not on file  . Years of education: Not on file  . Highest education level: Not on file  Occupational History  . Occupation: [employer]  Tobacco Use  . Smoking status: Current Every Day Smoker    Types: E-cigarettes  . Smokeless tobacco: Never Used  Vaping Use  . Vaping Use: Every day  . Substances: Nicotine   Substance and Sexual Activity  . Alcohol use: Yes    Comment: occasionally  . Drug use: Yes    Types: Marijuana    Comment: every other day use  . Sexual activity: Yes    Partners: Female    Birth control/protection: I.U.D.  Other Topics Concern  . Not on file  Social History Narrative  . Not on file   Social Determinants of Health   Financial Resource Strain: Not on file  Food Insecurity: Not on file  Transportation Needs: Not on file  Physical Activity: Not on file  Stress: Not on file  Social Connections: Not on file  Intimate Partner Violence: Not on file    ROS Review of Systems  Constitutional: Negative for chills, fatigue, fever and unexpected weight change.  Respiratory: Negative for cough, chest tightness, shortness of breath and wheezing.   Cardiovascular: Positive for palpitations (with increased anxiety). Negative for chest pain and leg swelling.  Endocrine: Negative for cold intolerance, heat intolerance, polydipsia, polyphagia and polyuria.  Allergic/Immunologic: Negative for environmental allergies and food allergies.  Neurological: Positive for dizziness (with standing up too fast) and headaches. Negative for seizures and light-headedness.  Psychiatric/Behavioral: Positive for dysphoric mood. Negative for self-injury, sleep disturbance and suicidal ideas. The patient is nervous/anxious.     Objective:   Today's Vitals: BP 103/66   Pulse 63  Temp 98 F (36.7 C)   Ht 6' 0.25" (1.835 m)   Wt 154 lb 4.8 oz (70 kg)   SpO2 98%   BMI 20.78 kg/m   Physical Exam Vitals and nursing note reviewed.  Constitutional:      General: He is not in acute distress.    Appearance: Normal appearance.  HENT:     Head: Normocephalic and atraumatic.  Cardiovascular:     Rate and Rhythm: Normal rate and regular rhythm.     Pulses: Normal pulses.     Heart sounds: Normal heart sounds. No murmur heard. No friction rub. No gallop.   Pulmonary:     Effort: Pulmonary  effort is normal. No respiratory distress.     Breath sounds: Normal breath sounds.  Abdominal:     General: Abdomen is flat. Bowel sounds are normal. There is no distension.     Palpations: Abdomen is soft. There is no mass.     Tenderness: There is no abdominal tenderness. There is no guarding or rebound.     Hernia: No hernia is present.  Skin:    General: Skin is warm and dry.  Neurological:     Mental Status: He is alert and oriented to person, place, and time.  Psychiatric:        Mood and Affect: Mood normal.        Behavior: Behavior normal.        Thought Content: Thought content normal.        Judgment: Judgment normal.    Assessment & Plan:   1. Encounter to establish care Reviewed available information and discussed healthcare concerns with patient.  2. Bipolar I disorder, most recent episode depressed (HCC) Managed by psychiatry.  3. Need for hepatitis C screening test Discussed many recommendations.  He is agreeable so we will add this to blood work today. - Hepatitis C antibody  4. Need for HPV vaccination HPV vaccine given today.  Next will be due in 4 weeks.  Okay to schedule for nurse visit with [place] administration. - HPV 9-valent vaccine,Recombinat  5. Screening for HIV (human immunodeficiency virus) Discuss screening recommendations.  He is agreeable so we will add this to blood work today as well. - HIV Antibody (routine testing w rflx)  6. Change in stool Unclear etiology.  Recent CBC and CMP with no indications for cause.  Urged patient to collect stool sample and return to the lab for further evaluation.  Since this was ordered by different provider, advised that he should give [place] a call and let me know so I can be on the watch for his results.  We will go ahead and check a TSH to make sure there is no thyroid dysfunction since this does run in his family. - TSH   Outpatient Encounter Medications as of 12/03/2020  Medication Sig  . Cholecalciferol 125  MCG (5000 UT) capsule Take 5,000 Units by mouth daily.  Marland Kitchen OLANZapine (ZYPREXA) 20 MG tablet Take 20 mg by mouth daily.  . ondansetron (ZOFRAN ODT) 4 MG disintegrating tablet Take 1 tablet (4 mg total) by mouth every 8 (eight) hours as needed for nausea or vomiting.  . risperiDONE (RISPERDAL) 0.5 MG tablet Take 0.5 mg by mouth 2 (two) times daily.  . TRINTELLIX 20 MG TABS tablet Take 20 mg by mouth daily.  . [DISCONTINUED] OLANZapine (ZYPREXA) 15 MG tablet Take 1 tablet (15 mg total) by mouth at bedtime.  . [DISCONTINUED] risperiDONE (RISPERDAL) 0.25 MG tablet Take  0.25 mg by mouth at bedtime.  . [DISCONTINUED] rizatriptan (MAXALT) 10 MG tablet Take 1 tablet (10 mg total) by mouth as needed for migraine. May repeat in 2 hours if needed  . [DISCONTINUED] vortioxetine HBr (TRINTELLIX) 10 MG TABS tablet Take 10 mg by mouth daily.   No facility-administered encounter medications on file as of 12/03/2020.    Follow-up: Return if symptoms worsen or fail to improve.   Thayer Ohm, DNP, APRN, FNP-BC [facility]

## 2020-12-03 NOTE — Patient Instructions (Signed)
HPV (Human Papillomavirus) Vaccine: What You Need to Know 1. Why get vaccinated? HPV (human papillomavirus) vaccine can prevent infection with some types of human papillomavirus. HPV infections can cause certain types of cancers, including:  cervical, vaginal, and vulvar cancers in women  penile cancer in men  anal cancers in both men and women  cancers of tonsils, base of tongue, and back of throat (oropharyngeal cancer) in both men and women HPV infections can also cause anogenital warts. HPV vaccine can prevent over 90% of cancers caused by HPV. HPV is spread through intimate skin-to-skin or sexual contact. HPV infections are so common that nearly all people will get at least one type of HPV at some time in their lives. Most HPV infections go away on their own within 2 years. But sometimes HPV infections will last longer and can cause cancers later in life. 2. HPV vaccine HPV vaccine is routinely recommended for adolescents at 11 or 24 years of age to ensure they are protected before they are exposed to the virus. HPV vaccine may be given beginning at age 9 years and vaccination is recommended for everyone through 24 years of age. HPV vaccine may be given to adults 27 through 24 years of age, based on discussions between the patient and health care provider. Most children who get the first dose before 15 years of age need 2 doses of HPV vaccine. People who get the first dose at or after 15 years of age and younger people with certain immunocompromising conditions need 3 doses. Your health care provider can give you more information. HPV vaccine may be given at the same time as other vaccines. 3. Talk with your health care provider Tell your vaccination provider if the person getting the vaccine:  Has had an allergic reaction after a previous dose of HPV vaccine, or has any severe, life-threatening allergies  Is pregnant--HPV vaccine is not recommended until after pregnancy In some cases,  your health care provider may decide to postpone HPV vaccination until a future visit. People with minor illnesses, such as a cold, may be vaccinated. People who are moderately or severely ill should usually wait until they recover before getting HPV vaccine. Your health care provider can give you more information. 4. Risks of a vaccine reaction  Soreness, redness, or swelling where the shot is given can happen after HPV vaccination.  Fever or headache can happen after HPV vaccination. People sometimes faint after medical procedures, including vaccination. Tell your provider if you feel dizzy or have vision changes or ringing in the ears. As with any medicine, there is a very remote chance of a vaccine causing a severe allergic reaction, other serious injury, or death. 5. What if there is a serious problem? An allergic reaction could occur after the vaccinated person leaves the clinic. If you see signs of a severe allergic reaction (hives, swelling of the face and throat, difficulty breathing, a fast heartbeat, dizziness, or weakness), call 9-1-1 and get the person to the nearest hospital. For other signs that concern you, call your health care provider. Adverse reactions should be reported to the Vaccine Adverse Event Reporting System (VAERS). Your health care provider will usually file this report, or you can do it yourself. Visit the VAERS website at www.vaers.hhs.gov or call 1-800-822-7967. VAERS is only for reporting reactions, and VAERS staff members do not give medical advice. 6. The National Vaccine Injury Compensation Program The National Vaccine Injury Compensation Program (VICP) is a federal program that was created   to compensate people who may have been injured by certain vaccines. Claims regarding alleged injury or death due to vaccination have a time limit for filing, which may be as short as two years. Visit the VICP website at www.hrsa.gov/vaccinecompensation or call 1-800-338-2382 to  learn about the program and about filing a claim. 7. How can I learn more?  Ask your health care provider.  Call your local or state health department.  Visit the website of the Food and Drug Administration (FDA) for vaccine package inserts and additional information at www.fda.gov/vaccines-blood-biologics/vaccines.  Contact the Centers for Disease Control and Prevention (CDC): ? Call 1-800-232-4636 (1-800-CDC-INFO) or ? Visit CDC's website at www.cdc.gov/vaccines. Vaccine Information Statement HPV Vaccine (02/23/2020) This information is not intended to replace advice given to you by your health care provider. Make sure you discuss any questions you have with your health care provider. Document Revised: 04/02/2020 Document Reviewed: 04/02/2020 Elsevier Patient Education  2021 [employer]   

## 2020-12-04 LAB — HIV ANTIBODY (ROUTINE TESTING W REFLEX): HIV 1&2 Ab, 4th Generation: NONREACTIVE

## 2020-12-04 LAB — HEPATITIS C ANTIBODY
Hepatitis C Ab: NONREACTIVE
SIGNAL TO CUT-OFF: 0.01 (ref <1.00)

## 2020-12-04 LAB — TSH: TSH: 1.37 mIU/L (ref 0.40–4.50)

## 2021-01-06 ENCOUNTER — Ambulatory Visit (HOSPITAL_COMMUNITY)
Admission: RE | Admit: 2021-01-06 | Discharge: 2021-01-06 | Disposition: A | Discharge status: Home / Self Care | Payer: 59 | Attending: Psychiatry | Admitting: Psychiatry
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
## 2021-01-07 ENCOUNTER — Telehealth (HOSPITAL_COMMUNITY): Payer: Self-pay | Admitting: Licensed Clinical Social Worker

## 2021-11-05 ENCOUNTER — Encounter: Payer: Self-pay | Admitting: Emergency Medicine

## 2021-11-05 ENCOUNTER — Emergency Department (INDEPENDENT_AMBULATORY_CARE_PROVIDER_SITE_OTHER)
Admission: EM | Admit: 2021-11-05 | Discharge: 2021-11-05 | Disposition: A | Discharge status: Home / Self Care | Payer: Managed Care, Other (non HMO) | Source: Home / Self Care | Attending: Family Medicine | Admitting: Family Medicine

## 2021-11-05 DIAGNOSIS — R1110 Vomiting, unspecified: Secondary | ICD-10-CM

## 2021-11-05 MED ORDER — ONDANSETRON 8 MG PO TBDP: 8.0000 mg | ORAL_TABLET | Freq: Three times a day (TID) | ORAL | 1 refills | Status: DC | PRN

## 2021-11-05 NOTE — ED Triage Notes (Signed)
Started vomiting  90 mins ago - took  medicine (latuda)at lunch   ?Started medicine last Monday  - a week ago  ?Denies  nausea at this time  ?Pt thinks the medication caused vomiting  ? ?

## 2021-11-05 NOTE — ED Provider Notes (Signed)
?[facility] ? ? ? ?CSN: [id number] ?Arrival date & time: 11/05/21  1559 ? ? ?  ? ?History   ?Chief Complaint ?Chief Complaint  ?Patient presents with  ? Emesis  ? ? ?HPI ?Ronald Stanley is a 26 y.o. male.  ? ?HPI ?Patient states he was placed on Latuda a week ago.  He has a history of having a sensitive stomach.  He states that new medicines usually cause nausea for the first couple of weeks.  Today while at work he vomited.  He is here because he needs a refill of his Zofran and also needs a note for work ? ? ?Past Medical History:  ?Diagnosis Date  ? Anxiety   ? Depression   ? ? ?Patient Active Problem List  ? Diagnosis Date Noted  ? Bipolar I disorder, most recent episode depressed ([place]) 03/21/2018  ? ? ?Past Surgical History:  ?Procedure Laterality Date  ? APPENDECTOMY    ? HERNIA REPAIR    ? MYRINGOTOMY WITH TUBE PLACEMENT    ? ? ? ? ? ?Home Medications   ? ?Prior to Admission medications   ?Medication Sig Start Date End Date Taking? Authorizing Provider  ?Amphetamine ER (ADZENYS XR-ODT) 15.7 MG TBED Take by mouth.   Yes [provider]  ?ondansetron (ZOFRAN-ODT) 8 MG disintegrating tablet Take 1 tablet (8 mg total) by mouth every 8 (eight) hours as needed for nausea or vomiting. 11/05/21  Yes Raylene Everts, MD  ?Cholecalciferol 125 MCG (5000 UT) capsule Take 5,000 Units by mouth daily.    [provider]  ?doxepin (SINEQUAN) 25 MG capsule SMARTSIG:1-2 Tablet(s) By Mouth PRN 07/25/21   [provider]  ?gabapentin (NEURONTIN) 300 MG capsule Take 300 mg by mouth 3 (three) times daily. 10/27/21   [provider]  ?lurasidone (LATUDA) 20 MG TABS tablet Take 20 mg by mouth daily. 10/27/21   [provider]  ?OLANZapine (ZYPREXA) 20 MG tablet Take 20 mg by mouth daily. 11/25/20   [provider]  ?QUEtiapine (SEROQUEL) 100 MG tablet PLEASE SEE ATTACHED FOR DETAILED DIRECTIONS 10/27/21   [provider]  ?TRINTELLIX 20 MG TABS tablet Take 20 mg  by mouth daily. 11/05/20   [provider]  ? ? ?Family History ?Family History  ?Problem Relation Age of Onset  ? Depression Mother   ? Hyperlipidemia Father   ? Hypertension Father   ? Heart attack Paternal Grandfather   ? ? ?Social History ?Social History  ? ?Tobacco Use  ? Smoking status: Every Day  ?  Types: E-cigarettes  ? Smokeless tobacco: Never  ?Vaping Use  ? Vaping Use: Every day  ? Substances: Nicotine, Flavoring  ?Substance Use Topics  ? Alcohol use: Not Currently  ? Drug use: Not Currently  ?  Types: Marijuana  ? ? ? ?Allergies   ?Patient has no known allergies. ? ? ?Review of Systems ?Review of Systems ? ?See HPI ?Physical Exam ?Triage Vital Signs ?ED Triage Vitals  ?Enc Vitals Group  ?   BP 11/05/21 1614 (!) 128/91  ?   Pulse Rate 11/05/21 1614 (!) 106  ?   Resp 11/05/21 1614 17  ?   Temp 11/05/21 1614 98.8 ?F (37.1 ?C)  ?   Temp Source 11/05/21 1614 Oral  ?   SpO2 11/05/21 1614 98 %  ?   Weight 11/05/21 1615 155 lb (70.3 kg)  ?   Height 11/05/21 1615 6' (1.829 m)  ?   Head Circumference --   ?  Peak Flow --   ?   Pain Score 11/05/21 1615 0  ?   Pain Loc --   ?   Pain Edu? --   ?   Excl. in [place]? --   ? ?No data found. ? ?Updated Vital Signs ?BP (!) 128/91 (BP Location: Right Arm)   Pulse (!) 106   Temp 98.8 ?F (37.1 ?C) (Oral)   Resp 17   Ht 6' (1.829 m)   Wt 70.3 kg   SpO2 98%   BMI 21.02 kg/m?  ? ?Visual Acuity ?Right Eye Distance:   ?Left Eye Distance:   ?Bilateral Distance:   ? ?Right Eye Near:   ?Left Eye Near:    ?Bilateral Near:    ? ?Physical Exam ?Constitutional:   ?   General: He is not in acute distress. ?   Appearance: He is well-developed.  ?HENT:  ?   Head: Normocephalic and atraumatic.  ?Eyes:  ?   Conjunctiva/sclera: Conjunctivae normal.  ?   Pupils: Pupils are equal, round, and reactive to light.  ?Cardiovascular:  ?   Rate and Rhythm: Normal rate.  ?Pulmonary:  ?   Effort: Pulmonary effort is normal. No respiratory distress.  ?Abdominal:  ?   General: There is no  distension.  ?   Palpations: Abdomen is soft.  ?Musculoskeletal:     ?   General: Normal range of motion.  ?   Cervical back: Normal range of motion.  ?Skin: ?   General: Skin is warm and dry.  ?Neurological:  ?   Mental Status: He is alert.  ? ? ? ?UC Treatments / Results  ?Labs ?(all labs ordered are listed, but only abnormal results are displayed) ?Labs Reviewed - No data to display ? ?EKG ? ? ?Radiology ?No results found. ? ?Procedures ?Procedures (including critical care time) ? ?Medications Ordered in UC ?Medications - No data to display ? ?Initial Impression / Assessment and Plan / UC Course  ?I have reviewed the triage vital signs and the nursing notes. ? ?Pertinent labs & imaging results that were available during my care of the patient were reviewed by me and considered in my medical decision making (see chart for details). ? ?  ? ? ?Final Clinical Impressions(s) / UC Diagnoses  ? ?Final diagnoses:  ?Vomiting, unspecified vomiting type, unspecified whether nausea present  ? ? ? ?Discharge Instructions   ? ?  ?Take Zofran as needed for vomiting ? ? ?ED Prescriptions   ? ? Medication Sig Dispense Auth. Provider  ? ondansetron (ZOFRAN-ODT) 8 MG disintegrating tablet Take 1 tablet (8 mg total) by mouth every 8 (eight) hours as needed for nausea or vomiting. 20 tablet Raylene Everts, MD  ? ?  ? ?PDMP not reviewed this encounter. ?  ?Raylene Everts, MD ?11/05/21 1800 ? ?

## 2021-11-05 NOTE — Discharge Instructions (Signed)
Take Zofran as needed for vomiting ?

## 2022-09-24 ENCOUNTER — Ambulatory Visit
Admission: EM | Admit: 2022-09-24 | Discharge: 2022-09-24 | Disposition: A | Discharge status: Home / Self Care | Payer: Managed Care, Other (non HMO) | Attending: Urgent Care | Admitting: Urgent Care

## 2022-09-24 ENCOUNTER — Encounter: Payer: Self-pay | Admitting: Emergency Medicine

## 2022-09-24 ENCOUNTER — Other Ambulatory Visit: Payer: Self-pay

## 2022-09-24 DIAGNOSIS — R052 Subacute cough: Secondary | ICD-10-CM | POA: Insufficient documentation

## 2022-09-24 DIAGNOSIS — B349 Viral infection, unspecified: Secondary | ICD-10-CM | POA: Diagnosis present

## 2022-09-24 DIAGNOSIS — Z1152 Encounter for screening for COVID-19: Secondary | ICD-10-CM | POA: Diagnosis not present

## 2022-09-24 DIAGNOSIS — R070 Pain in throat: Secondary | ICD-10-CM | POA: Diagnosis present

## 2022-09-24 LAB — POCT RAPID STREP A (OFFICE): Rapid Strep A Screen: NEGATIVE

## 2022-09-24 MED ORDER — ONDANSETRON 8 MG PO TBDP: 8.0000 mg | ORAL_TABLET | Freq: Three times a day (TID) | ORAL | 0 refills | Status: DC | PRN

## 2022-09-24 MED ORDER — CETIRIZINE HCL 10 MG PO TABS: 10.0000 mg | ORAL_TABLET | Freq: Every day | ORAL | 0 refills | Status: DC

## 2022-09-24 MED ORDER — PROMETHAZINE-DM 6.25-15 MG/5ML PO SYRP: 2.5000 mL | ORAL_SOLUTION | Freq: Three times a day (TID) | ORAL | 0 refills | Status: DC | PRN

## 2022-09-24 MED ORDER — IPRATROPIUM BROMIDE 0.03 % NA SOLN: 2.0000 | Freq: Two times a day (BID) | NASAL | 0 refills | Status: DC

## 2022-09-24 NOTE — ED Provider Notes (Signed)
[facility]  Note:  This document was prepared using Systems analyst and may include unintentional dictation errors.  MRN: [id number] DOB: 09/08/1995  Subjective:   Ronald Stanley is a 27 y.o. male presenting for 3-4 day history of coughing, nausea and vomiting (since yesterday), diarrhea, throat pain. No fever, chest pain, shob, wheezing. No fever, bloody stools, dark stools, recent antibiotic use, hospitalizations or long distance travel.  However, he was in the hospital for ~24 hours over the past weekend for an intentional overdose. Has not eaten raw foods, drank unfiltered water.  No history of GI disorders including Crohn's, IBS, ulcerative colitis.  Patient is taking lithium, divalproex, olanzapine and was recently started on lurasidone.  No current facility-administered medications for this encounter.  Current Outpatient Medications:    divalproex (DEPAKOTE) 500 MG DR tablet, Take 100 mg by mouth daily., Disp: , Rfl:    lithium carbonate 300 MG capsule, Take 450 mg by mouth 2 (two) times daily with a meal., Disp: , Rfl:    Amphetamine ER (ADZENYS XR-ODT) 15.7 MG TBED, Take by mouth., Disp: , Rfl:    Cholecalciferol 125 MCG (5000 UT) capsule, Take 5,000 Units by mouth daily., Disp: , Rfl:    doxepin (SINEQUAN) 25 MG capsule, SMARTSIG:1-2 Tablet(s) By Mouth PRN, Disp: , Rfl:    gabapentin (NEURONTIN) 300 MG capsule, Take 300 mg by mouth 3 (three) times daily., Disp: , Rfl:    lurasidone (LATUDA) 20 MG TABS tablet, Take 20 mg by mouth daily., Disp: , Rfl:    OLANZapine (ZYPREXA) 20 MG tablet, Take 20 mg by mouth daily., Disp: , Rfl:    ondansetron (ZOFRAN-ODT) 8 MG disintegrating tablet, Take 1 tablet (8 mg total) by mouth every 8 (eight) hours as needed for nausea or vomiting., Disp: 20 tablet, Rfl: 1   QUEtiapine (SEROQUEL) 100 MG tablet, PLEASE SEE ATTACHED FOR DETAILED DIRECTIONS, Disp: , Rfl:    TRINTELLIX 20 MG TABS tablet, Take 20 mg by  mouth daily., Disp: , Rfl:    No Known Allergies  Past Medical History:  Diagnosis Date   Anxiety    Depression      Past Surgical History:  Procedure Laterality Date   APPENDECTOMY     HERNIA REPAIR     MYRINGOTOMY WITH TUBE PLACEMENT      Family History  Problem Relation Age of Onset   Depression Mother    Hyperlipidemia Father    Hypertension Father    Heart attack Paternal Grandfather     Social History   Tobacco Use   Smoking status: Every Day    Types: E-cigarettes   Smokeless tobacco: Never  Vaping Use   Vaping Use: Every day   Substances: Nicotine, Flavoring  Substance Use Topics   Alcohol use: Not Currently   Drug use: Not Currently    Types: Marijuana    ROS   Objective:   Vitals: BP 137/85 (BP Location: Right Arm)   Pulse 92 Comment: With Gust Eugene, APP in room during evaluation  Temp 98 F (36.7 C) (Oral)   Resp 18   SpO2 98%   Physical Exam Constitutional:      General: He is not in acute distress.    Appearance: Normal appearance. He is well-developed and normal weight. He is not ill-appearing, toxic-appearing or diaphoretic.  HENT:     Head: Normocephalic and atraumatic.     Right Ear: Tympanic membrane, ear canal and external ear normal. No drainage, swelling  or tenderness. No middle ear effusion. There is no impacted cerumen. Tympanic membrane is not erythematous or bulging.     Left Ear: Tympanic membrane, ear canal and external ear normal. No drainage, swelling or tenderness.  No middle ear effusion. There is no impacted cerumen. Tympanic membrane is not erythematous or bulging.     Nose: Nose normal. No congestion or rhinorrhea.     Mouth/Throat:     Mouth: Mucous membranes are moist.     Pharynx: No oropharyngeal exudate or posterior oropharyngeal erythema.  Eyes:     General: No scleral icterus.       Right eye: No discharge.        Left eye: No discharge.     Extraocular Movements: Extraocular movements intact.      Conjunctiva/sclera: Conjunctivae normal.  Cardiovascular:     Rate and Rhythm: Normal rate and regular rhythm.     Heart sounds: Normal heart sounds. No murmur heard.    No friction rub. No gallop.  Pulmonary:     Effort: Pulmonary effort is normal. No respiratory distress.     Breath sounds: Normal breath sounds. No stridor. No wheezing, rhonchi or rales.  Abdominal:     General: Bowel sounds are normal. There is no distension.     Palpations: Abdomen is soft. There is no mass.     Tenderness: There is no abdominal tenderness. There is no right CVA tenderness, left CVA tenderness, guarding or rebound.  Musculoskeletal:     Cervical back: Normal range of motion and neck supple. No rigidity. No muscular tenderness.  Neurological:     General: No focal deficit present.     Mental Status: He is alert and oriented to person, place, and time.  Psychiatric:        Mood and Affect: Mood normal.        Behavior: Behavior normal.        Thought Content: Thought content normal.     Results for orders placed or performed during the hospital encounter of 09/24/22 (from the past 24 hour(s))  POCT rapid strep A     Status: None   Collection Time: 09/24/22 12:02 PM  Result Value Ref Range   Rapid Strep A Screen Negative Negative    Assessment and Plan :   PDMP not reviewed this encounter.  1. Acute viral syndrome   2. Throat pain   3. Subacute cough     No signs of an acute abdomen. Deferred imaging given clear cardiopulmonary exam, hemodynamically stable vital signs.  Will manage for viral illness such as viral URI, viral syndrome, viral rhinitis, COVID-19, viral pharyngitis. Recommended supportive care. Offered scripts for symptomatic relief. COVID 19 and strep culture are pending. Counseled patient on potential for adverse effects with medications prescribed/recommended today, ER and return-to-clinic precautions discussed, patient verbalized understanding.     Jaynee Eagles,  PA-C 09/24/22 1210

## 2022-09-24 NOTE — Discharge Instructions (Signed)
We will notify you of your test results as they arrive and may take between about 24 hours.  I encourage you to sign up for MyChart if you have not already done so as this can be the easiest way for [place] to communicate results to you online or through a phone app.  Generally, we only contact you if it is a positive test result.  In the meantime, if you develop worsening symptoms including fever, chest pain, shortness of breath despite our current treatment plan then please report to the emergency room as this may be a sign of worsening status from possible viral infection.  Otherwise, we will manage this as a viral syndrome. For sore throat or cough try using a honey-based tea. Use 3 teaspoons of honey with juice squeezed from half lemon. Place shaved pieces of ginger into 1/2-1 cup of water and warm over stove top. Then mix the ingredients and repeat every 4 hours as needed. Please take Tylenol '500mg'$ -'650mg'$  every 6 hours for aches and pains, fevers. Hydrate very well with at least 2 liters of water. Eat light meals such as soups to replenish electrolytes and soft fruits, veggies. Start an antihistamine like Zyrtec ('10mg'$  daily) for postnasal drainage, sinus congestion.  You can take this together with Atrovent nasal spray 2-3 times a day as needed for the same kind of congestion.  Use the cough and Zofran medications as needed.

## 2022-09-24 NOTE — ED Triage Notes (Signed)
Patient presents to [facility] for evaluation after being at the hospital Saturday morning and since has had a cough.  Then starting yesterday began to have nausea and vomiting.  3 episodes of emesis I the past 24 hours.  Denies abdominal pain.  C/o mild sore throat, denies ear pain.

## 2022-09-25 LAB — SARS CORONAVIRUS 2 (TAT 6-24 HRS): SARS Coronavirus 2: NEGATIVE

## 2022-09-27 LAB — CULTURE, GROUP A STREP (THRC)

## 2023-01-11 ENCOUNTER — Ambulatory Visit: Payer: Managed Care, Other (non HMO) | Admitting: Family Medicine

## 2023-01-11 ENCOUNTER — Ambulatory Visit (INDEPENDENT_AMBULATORY_CARE_PROVIDER_SITE_OTHER): Payer: Managed Care, Other (non HMO)

## 2023-01-11 ENCOUNTER — Encounter: Payer: Self-pay | Admitting: Family Medicine

## 2023-01-11 VITALS — BP 142/89 | HR 74 | Temp 98.1°F | Resp 16 | Ht 72.0 in

## 2023-01-11 DIAGNOSIS — R079 Chest pain, unspecified: Secondary | ICD-10-CM

## 2023-01-11 DIAGNOSIS — F1911 Other psychoactive substance abuse, in remission: Secondary | ICD-10-CM | POA: Diagnosis not present

## 2023-01-11 DIAGNOSIS — Z7689 Persons encountering health services in other specified circumstances: Secondary | ICD-10-CM

## 2023-01-11 DIAGNOSIS — R112 Nausea with vomiting, unspecified: Secondary | ICD-10-CM | POA: Diagnosis not present

## 2023-01-11 DIAGNOSIS — Z1159 Encounter for screening for other viral diseases: Secondary | ICD-10-CM

## 2023-01-11 DIAGNOSIS — R7302 Impaired glucose tolerance (oral): Secondary | ICD-10-CM | POA: Diagnosis not present

## 2023-01-11 DIAGNOSIS — F3130 Bipolar disorder, current episode depressed, mild or moderate severity, unspecified: Secondary | ICD-10-CM

## 2023-01-11 DIAGNOSIS — Z13220 Encounter for screening for lipoid disorders: Secondary | ICD-10-CM

## 2023-01-11 MED ORDER — ONDANSETRON HCL 4 MG PO TABS: 4.0000 mg | ORAL_TABLET | Freq: Three times a day (TID) | ORAL | 0 refills | Status: DC | PRN

## 2023-01-11 NOTE — Progress Notes (Signed)
New Patient Office Visit  Subjective    Patient ID: Ronald Stanley, male    DOB: 10-Jul-1996  Age: 27 y.o. MRN: [id number]  CC:  Chief Complaint  Patient presents with   Establish Care    Issues with eating 2/ 3 week nausea, lower left rib pain , chest pains     HPI Ronald Stanley presents to establish care  Pt reports coming off his psych medicines Depakote, Olanzapine, Hydroxyzine prn, Latuda 2 months ago for bipolar disorder. He reports a hx of trying a lot of different psychiatric medicines for bipolar but none of them working well for him.   He reports for the last 2 weeks, he is unable to eat. He will start gagging and vomiting when he tries to eat. A week ago, he's had left lower rib pain along with 9pm last night, had left shoulder pain with numbness in his hand. He reports chest pains that occurred last night but none since then.  He reports he came off of his medicines and titrated off of them over a week. He reports he had no emotion, sleeping all the time, continued substance abuse issues?. He reports cocaine use and had to go to treatment centers in 2022 and was clean for 7 months. He also went back last year and was clean for some months but had a set back recently. He felt like the side effects was doing more harm than good of his mental health medicines and this is why he stopped them. He felt like he wanted to try to manage it himself. He says he feels more human and have emotions but is sporadic with his mood. It takes him a while to fall asleep.  He has seen his mental health provider and told her that he wanted to come off of this. He denies diarrhea. He has the urge to eat but unable.  He reports snorting the cocaine.  He did have some chest tightness with difficulty breathing that occurred last night and he has family hx of heart disease, so he was worried.   Outpatient Encounter Medications as of 01/11/2023  Medication Sig   atomoxetine (STRATTERA) 40 MG capsule Take  40 mg by mouth daily. (Patient not taking: Reported on 01/11/2023)   cetirizine (ZYRTEC ALLERGY) 10 MG tablet Take 1 tablet (10 mg total) by mouth daily. (Patient not taking: Reported on 01/11/2023)   Cholecalciferol 125 MCG (5000 UT) capsule Take 5,000 Units by mouth daily. (Patient not taking: Reported on 01/11/2023)   divalproex (DEPAKOTE ER) 500 MG 24 hr tablet Take 1,000 mg by mouth at bedtime. (Patient not taking: Reported on 01/11/2023)   doxepin (SINEQUAN) 25 MG capsule SMARTSIG:1-2 Tablet(s) By Mouth PRN (Patient not taking: Reported on 01/11/2023)   gabapentin (NEURONTIN) 300 MG capsule Take 300 mg by mouth 3 (three) times daily. (Patient not taking: Reported on 01/11/2023)   hydrOXYzine (ATARAX) 25 MG tablet Take 25 mg by mouth every 8 (eight) hours as needed. (Patient not taking: Reported on 01/11/2023)   ipratropium (ATROVENT) 0.03 % nasal spray Place 2 sprays into both nostrils 2 (two) times daily. (Patient not taking: Reported on 01/11/2023)   lithium carbonate 300 MG capsule Take 450 mg by mouth 2 (two) times daily with a meal. (Patient not taking: Reported on 01/11/2023)   lurasidone (LATUDA) 20 MG TABS tablet Take 20 mg by mouth daily. (Patient not taking: Reported on 01/11/2023)   Lurasidone HCl 60 MG TABS Take 1 tablet by mouth  daily. (Patient not taking: Reported on 01/11/2023)   OLANZapine (ZYPREXA) 20 MG tablet Take 20 mg by mouth daily. (Patient not taking: Reported on 01/11/2023)   ondansetron (ZOFRAN-ODT) 8 MG disintegrating tablet Take 1 tablet (8 mg total) by mouth every 8 (eight) hours as needed for nausea or vomiting. (Patient not taking: Reported on 01/11/2023)   promethazine-dextromethorphan (PROMETHAZINE-DM) 6.25-15 MG/5ML syrup Take 2.5 mLs by mouth 3 (three) times daily as needed for cough. (Patient not taking: Reported on 01/11/2023)   QUEtiapine (SEROQUEL) 100 MG tablet PLEASE SEE ATTACHED FOR DETAILED DIRECTIONS (Patient not taking: Reported on 01/11/2023)   sertraline (ZOLOFT)  25 MG tablet Take 25 mg by mouth daily. (Patient not taking: Reported on 01/11/2023)   TRINTELLIX 20 MG TABS tablet Take 20 mg by mouth daily. (Patient not taking: Reported on 01/11/2023)   [DISCONTINUED] Amphetamine ER (ADZENYS XR-ODT) 15.7 MG TBED Take by mouth.   [DISCONTINUED] divalproex (DEPAKOTE) 500 MG DR tablet Take 100 mg by mouth daily.   No facility-administered encounter medications on file as of 01/11/2023.    Past Medical History:  Diagnosis Date   Anxiety    Depression     Past Surgical History:  Procedure Laterality Date   APPENDECTOMY     HERNIA REPAIR     MYRINGOTOMY WITH TUBE PLACEMENT      Family History  Problem Relation Age of Onset   Depression Mother    Hyperlipidemia Father    Hypertension Father    Heart attack Paternal Grandfather     Social History   Socioeconomic History   Marital status: Single    Spouse name: Not on file   Number of children: Not on file   Years of education: Not on file   Highest education level: Not on file  Occupational History   Occupation: [employer]  Tobacco Use   Smoking status: Every Day    Types: E-cigarettes   Smokeless tobacco: Never  Vaping Use   Vaping Use: Every day   Substances: Nicotine, Flavoring  Substance and Sexual Activity   Alcohol use: Not Currently   Drug use: Not Currently    Types: Marijuana   Sexual activity: Yes    Partners: Female    Comment: partner has an IUD  Other Topics Concern   Not on file  Social History Narrative   Not on file   Social Determinants of Health   Financial Resource Strain: Low Risk  (04/28/2019)   Overall Financial Resource Strain (CARDIA)    Difficulty of Paying Living Expenses: Not hard at all  Food Insecurity: No Food Insecurity (04/28/2019)   Hunger Vital Sign    Worried About Running Out of Food in the Last Year: Never true    Ran Out of Food in the Last Year: Never true  Transportation Needs: Not on file  Physical Activity: Inactive  (04/28/2019)   Exercise Vital Sign    Days of Exercise per Week: 0 days    Minutes of Exercise per Session: 0 min  Stress: Stress Concern Present (04/28/2019)   [employer] of Occupational Health - Occupational Stress Questionnaire    Feeling of Stress : Very much  Social Connections: Somewhat Isolated (04/28/2019)   Social Connection and Isolation Panel [NHANES]    Frequency of Communication with Friends and Family: More than three times a week    Frequency of Social Gatherings with Friends and Family: More than three times a week    Attends Religious Services: More than 4  times per year    Active Member of Clubs or Organizations: No    Attends Banker Meetings: Not asked    Marital Status: Never married  Intimate Partner Violence: Not At Risk (04/28/2019)   Humiliation, Afraid, Rape, and Kick questionnaire    Fear of Current or Ex-Partner: No    Emotionally Abused: No    Physically Abused: No    Sexually Abused: No    Review of Systems  Cardiovascular:  Positive for chest pain.  Gastrointestinal:  Positive for abdominal pain, nausea and vomiting. Negative for blood in stool, constipation, diarrhea, heartburn and melena.       Left rib pain  Psychiatric/Behavioral:  Positive for depression and substance abuse. The patient is nervous/anxious.   All other systems reviewed and are negative.      Objective    BP (!) 142/89   Pulse 74   Temp 98.1 F (36.7 C) (Oral)   Resp 16   Ht 6' (1.829 m)   SpO2 99%   BMI 21.02 kg/m   Physical Exam Vitals and nursing note reviewed.  Constitutional:      Appearance: Normal appearance. He is normal weight.  HENT:     Head: Normocephalic and atraumatic.     Right Ear: External ear normal.     Left Ear: External ear normal.     Nose: Nose normal.     Mouth/Throat:     Mouth: Mucous membranes are moist.     Pharynx: Oropharynx is clear.  Eyes:     Conjunctiva/sclera: Conjunctivae normal.     Pupils: Pupils are  equal, round, and reactive to light.  Cardiovascular:     Rate and Rhythm: Normal rate and regular rhythm.     Pulses: Normal pulses.     Heart sounds: Normal heart sounds.  Pulmonary:     Effort: Pulmonary effort is normal.     Breath sounds: Normal breath sounds.  Abdominal:     General: Abdomen is flat. Bowel sounds are normal.     Palpations: Abdomen is soft.  Skin:    General: Skin is warm.     Capillary Refill: Capillary refill takes less than 2 seconds.  Neurological:     General: No focal deficit present.     Mental Status: He is alert and oriented to person, place, and time. Mental status is at baseline.  Psychiatric:        Mood and Affect: Mood normal.        Behavior: Behavior normal.        Thought Content: Thought content normal.        Judgment: Judgment normal.       Assessment & Plan:   Problem List Items Addressed This Visit   None Encounter to establish care with new doctor -     TSH -     T4, free  Encounter for lipid screening for cardiovascular disease -     Lipid panel  Impaired glucose tolerance -     CBC with Differential/Platelet -     Comprehensive metabolic panel -     Hemoglobin A1c  Chest pain, unspecified type -     Troponin T -     DG Chest 2 View; Future  Hx of substance abuse (HCC)  Screening for viral disease -     HepB+HepC+HIV Panel  Nausea and vomiting, unspecified vomiting type -     Ondansetron HCl; Take 1 tablet (4 mg total) by mouth every  8 (eight) hours as needed for nausea or vomiting.  Dispense: 21 tablet; Refill: 0  Bipolar I disorder, most recent episode depressed (HCC)   Screening labs to rule out metabolic causes of symptoms. Will also include troponin  levels and CXR Zofran 4mg  TID prn for nausea before eating Advised to continue follow up with psychiatrist Will plan to see back in 2 weeks for follow up pending results.  Advised pt if chest pains reoccur, to go to ER asap.   No follow-ups on file.    Suzan Slick, MD

## 2023-01-13 LAB — TSH: TSH: 1.78 u[IU]/mL (ref 0.450–4.500)

## 2023-01-13 LAB — HEMOGLOBIN A1C
Est. average glucose Bld gHb Est-mCnc: 100 mg/dL
Hgb A1c MFr Bld: 5.1 % (ref 4.8–5.6)

## 2023-01-13 LAB — COMPREHENSIVE METABOLIC PANEL
ALT: 22 IU/L (ref 0–44)
AST: 19 IU/L (ref 0–40)
Albumin: 5.1 g/dL (ref 4.3–5.2)
Alkaline Phosphatase: 72 IU/L (ref 44–121)
BUN/Creatinine Ratio: 7 — ABNORMAL LOW (ref 9–20)
BUN: 7 mg/dL (ref 6–20)
Bilirubin Total: 1 mg/dL (ref 0.0–1.2)
CO2: 20 mmol/L (ref 20–29)
Calcium: 9.8 mg/dL (ref 8.7–10.2)
Chloride: 103 mmol/L (ref 96–106)
Creatinine, Ser: 1.04 mg/dL (ref 0.76–1.27)
Globulin, Total: 2.3 g/dL (ref 1.5–4.5)
Glucose: 83 mg/dL (ref 70–99)
Potassium: 4 mmol/L (ref 3.5–5.2)
Sodium: 141 mmol/L (ref 134–144)
Total Protein: 7.4 g/dL (ref 6.0–8.5)
eGFR: 102 mL/min/{1.73_m2} (ref >59)

## 2023-01-13 LAB — CBC WITH DIFFERENTIAL/PLATELET
Basophils Absolute: 0 10*3/uL (ref 0.0–0.2)
Basos: 1 %
EOS (ABSOLUTE): 0.1 10*3/uL (ref 0.0–0.4)
Eos: 1 %
Hematocrit: 46.3 % (ref 37.5–51.0)
Hemoglobin: 15.8 g/dL (ref 13.0–17.7)
Immature Grans (Abs): 0 10*3/uL (ref 0.0–0.1)
Immature Granulocytes: 0 %
Lymphocytes Absolute: 1.8 10*3/uL (ref 0.7–3.1)
Lymphs: 30 %
MCH: 28.4 pg (ref 26.6–33.0)
MCHC: 34.1 g/dL (ref 31.5–35.7)
MCV: 83 fL (ref 79–97)
Monocytes Absolute: 0.6 10*3/uL (ref 0.1–0.9)
Monocytes: 9 %
Neutrophils Absolute: 3.7 10*3/uL (ref 1.4–7.0)
Neutrophils: 59 %
Platelets: 247 10*3/uL (ref 150–450)
RBC: 5.56 x10E6/uL (ref 4.14–5.80)
RDW: 12.9 % (ref 11.6–15.4)
WBC: 6.2 10*3/uL (ref 3.4–10.8)

## 2023-01-13 LAB — HEPB+HEPC+HIV PANEL
HIV Screen 4th Generation wRfx: NONREACTIVE
Hep B C IgM: NEGATIVE
Hep B Core Total Ab: NEGATIVE
Hep B E Ab: NONREACTIVE
Hep B E Ag: NEGATIVE
Hep B Surface Ab, Qual: REACTIVE
Hep C Virus Ab: NONREACTIVE
Hepatitis B Surface Ag: NEGATIVE

## 2023-01-13 LAB — LIPID PANEL
Chol/HDL Ratio: 3 ratio (ref 0.0–5.0)
Cholesterol, Total: 116 mg/dL (ref 100–199)
HDL: 39 mg/dL — ABNORMAL LOW (ref >39)
LDL Chol Calc (NIH): 61 mg/dL (ref 0–99)
Triglycerides: 82 mg/dL (ref 0–149)
VLDL Cholesterol Cal: 16 mg/dL (ref 5–40)

## 2023-01-13 LAB — T4, FREE: Free T4: 1.68 ng/dL (ref 0.82–1.77)

## 2023-01-13 LAB — TROPONIN T: Troponin T (Highly Sensitive): 10 ng/L (ref 0–22)

## 2023-01-25 ENCOUNTER — Encounter: Payer: Self-pay | Admitting: Family Medicine

## 2023-01-25 ENCOUNTER — Ambulatory Visit (INDEPENDENT_AMBULATORY_CARE_PROVIDER_SITE_OTHER): Payer: Managed Care, Other (non HMO)

## 2023-01-25 ENCOUNTER — Ambulatory Visit (INDEPENDENT_AMBULATORY_CARE_PROVIDER_SITE_OTHER): Payer: Managed Care, Other (non HMO) | Admitting: Family Medicine

## 2023-01-25 VITALS — BP 122/78 | HR 82 | Temp 98.1°F | Resp 18 | Ht 72.0 in | Wt 136.8 lb

## 2023-01-25 DIAGNOSIS — R112 Nausea with vomiting, unspecified: Secondary | ICD-10-CM

## 2023-01-25 DIAGNOSIS — R1013 Epigastric pain: Secondary | ICD-10-CM

## 2023-01-25 DIAGNOSIS — F3130 Bipolar disorder, current episode depressed, mild or moderate severity, unspecified: Secondary | ICD-10-CM

## 2023-01-25 DIAGNOSIS — R634 Abnormal weight loss: Secondary | ICD-10-CM

## 2023-01-25 DIAGNOSIS — G4709 Other insomnia: Secondary | ICD-10-CM

## 2023-01-25 MED ORDER — IOHEXOL 300 MG/ML  SOLN
100.0000 mL | Freq: Once | INTRAMUSCULAR | Status: AC | PRN
Start: 2023-01-25 — End: 2023-01-25
  Administered 2023-01-25: 100 mL via INTRAVENOUS

## 2023-01-25 NOTE — Progress Notes (Signed)
Established Patient Office Visit  Subjective   Patient ID: Ronald Stanley, male    DOB: 1995/08/08  Age: 27 y.o. MRN: [id number]  Chief Complaint  Patient presents with   Follow-up    Patient is here for a follow up he states that since last visit , nothing has really improved her states that he still can't eat, or sleep.    HPI  Abdominal pain/Nausea/vomiting Pt reports he is vomiting when trying to eat. Not able to eat due to feeling nauseated with abdominal pains. He tried to eat grapes and couldn't keep it down last night. He denies melena or hematochezia. Continues to lose weight. Baseline weight 155 and today he is down to 136 lbs. He does report small bowel movements, last one yesterday. He has hx of appendectomy in 2017 and umbilical hernia at the age of 3.   Insomnia Pt also has hx of bipolar disorder. Reports unable to sleep. He has been seen by psychiatrist and has been on medications. He has stopped taking them all due to side effects.  Pt reports he hasn't seen previous psychiatrist in a few months. He is interested in seeing someone else. He reports he's been on a number of medications for bipolar disorder and Seroquel seemed to work the best.  Patient Active Problem List   Diagnosis Date Noted   Bipolar I disorder, current episode depressed (HCC) 03/18/2020   Bipolar I disorder, most recent episode depressed (HCC) 03/21/2018   Acute appendicitis 03/02/2016   Attention and concentration deficit 10/04/2014   Depressive disorder 10/04/2014   Malaise and fatigue 10/04/2014   Sleep difficulties 10/04/2014     Review of Systems  Constitutional:  Positive for weight loss.  Gastrointestinal:  Positive for abdominal pain, nausea and vomiting. Negative for blood in stool, constipation, diarrhea and melena.  Psychiatric/Behavioral:  The patient has insomnia.   All other systems reviewed and are negative.     Objective:     BP 122/78   Pulse 82   Temp 98.1 F (36.7  C) (Oral)   Resp 18   Ht 6' (1.829 m)   Wt 136 lb 12.8 oz (62.1 kg)   SpO2 98%   BMI 18.55 kg/m    Physical Exam Vitals and nursing note reviewed.  Constitutional:      Appearance: Normal appearance. He is normal weight.  HENT:     Head: Normocephalic and atraumatic.  Cardiovascular:     Rate and Rhythm: Normal rate and regular rhythm.     Pulses: Normal pulses.     Heart sounds: Normal heart sounds.  Pulmonary:     Effort: Pulmonary effort is normal.     Breath sounds: Normal breath sounds.  Abdominal:     General: Abdomen is flat. Bowel sounds are normal. There is no distension.     Palpations: There is no mass.     Tenderness: There is abdominal tenderness.  Skin:    General: Skin is warm.     Capillary Refill: Capillary refill takes less than 2 seconds.  Neurological:     General: No focal deficit present.     Mental Status: He is alert and oriented to person, place, and time. Mental status is at baseline.  Psychiatric:        Mood and Affect: Mood normal.        Behavior: Behavior normal.        Thought Content: Thought content normal.        Judgment:  Judgment normal.     No results found for any visits on 01/25/23.    The ASCVD Risk score (Arnett DK, et al., 2019) failed to calculate for the following reasons:   The 2019 ASCVD risk score is only valid for ages 33 to 86    Assessment & Plan:   Problem List Items Addressed This Visit       Other   Bipolar I disorder, most recent episode depressed (HCC)   Relevant Orders   Ambulatory referral to Behavioral Health   Other Visit Diagnoses     Nausea and vomiting, unspecified vomiting type    -  Primary   Relevant Orders   Lipase   Amylase   CT Abdomen Pelvis W Contrast   Celiac Disease Panel   Midepigastric pain       Relevant Orders   Lipase   Amylase   CT Abdomen Pelvis W Contrast   Celiac Disease Panel   Weight loss       Relevant Orders   Lipase   Amylase   CT Abdomen Pelvis W  Contrast   Celiac Disease Panel   Other insomnia       Relevant Orders   Ambulatory referral to Behavioral Health      Nausea and vomiting, unspecified vomiting type -     Lipase -     Amylase -     CT ABDOMEN PELVIS W CONTRAST -     Celiac Disease Panel  Midepigastric pain -     Lipase -     Amylase -     CT ABDOMEN PELVIS W CONTRAST -     Celiac Disease Panel  Weight loss -     Lipase -     Amylase -     CT ABDOMEN PELVIS W CONTRAST -     Celiac Disease Panel  Bipolar I disorder, most recent episode depressed (HCC) -     Ambulatory referral to Behavioral Health  Other insomnia -     Ambulatory referral to Behavioral Health  Due to continued weight loss, appetite changes, nausea/vomiting, and abdominal pains; send for ct scan abdomen and pelvis with contrast. Also screening amylase, lipase, and celiac panel. Pending results, refer to GI for further evaluation. Referred to [facility] for evaluation and treatment options of bipolar disorder and insomnia.  No follow-ups on file.    Suzan Slick, MD

## 2023-01-26 LAB — SPECIMEN STATUS REPORT

## 2023-01-26 LAB — LIPASE: Lipase: 24 U/L (ref 13–78)

## 2023-01-26 LAB — AMYLASE: Amylase: 70 U/L (ref 31–110)

## 2023-01-27 ENCOUNTER — Other Ambulatory Visit: Payer: Self-pay | Admitting: Family Medicine

## 2023-01-27 DIAGNOSIS — R1013 Epigastric pain: Secondary | ICD-10-CM

## 2023-01-27 DIAGNOSIS — R634 Abnormal weight loss: Secondary | ICD-10-CM

## 2023-01-27 DIAGNOSIS — R112 Nausea with vomiting, unspecified: Secondary | ICD-10-CM

## 2023-01-27 LAB — CELIAC DISEASE PANEL
Endomysial IgA: NEGATIVE
IgA/Immunoglobulin A, Serum: 133 mg/dL (ref 90–386)
Transglutaminase IgA: <2 U/mL (ref 0–3)

## 2023-01-27 LAB — SPECIMEN STATUS REPORT

## 2023-02-01 ENCOUNTER — Encounter: Payer: Self-pay | Admitting: Gastroenterology

## 2023-03-11 ENCOUNTER — Encounter (HOSPITAL_COMMUNITY): Payer: Self-pay | Admitting: Psychiatry

## 2023-03-11 ENCOUNTER — Ambulatory Visit (INDEPENDENT_AMBULATORY_CARE_PROVIDER_SITE_OTHER): Payer: BC Managed Care – PPO | Admitting: Psychiatry

## 2023-03-11 VITALS — BP 118/78 | HR 78 | Ht 73.0 in | Wt 128.0 lb

## 2023-03-11 DIAGNOSIS — F3130 Bipolar disorder, current episode depressed, mild or moderate severity, unspecified: Secondary | ICD-10-CM

## 2023-03-11 DIAGNOSIS — G479 Sleep disorder, unspecified: Secondary | ICD-10-CM

## 2023-03-11 DIAGNOSIS — F1421 Cocaine dependence, in remission: Secondary | ICD-10-CM | POA: Diagnosis not present

## 2023-03-11 MED ORDER — QUETIAPINE FUMARATE 100 MG PO TABS: 100.0000 mg | ORAL_TABLET | Freq: Every day | ORAL | 0 refills | Status: DC

## 2023-03-11 NOTE — Progress Notes (Signed)
Psychiatric Initial Adult Assessment   Patient Identification: Ronald Stanley MRN:  [id number] Date of Evaluation:  03/11/2023 Referral Source: primary care Chief Complaint:   Chief Complaint  Patient presents with   Establish Care   Visit Diagnosis:    ICD-10-CM   1. Bipolar I disorder, most recent episode depressed (HCC)  F31.30     2. Sleep difficulties  G47.9       History of Present Illness: Patient is a 27 years old currently single Caucasian male referred by primary care physician to establish care with a past diagnosis of bipolar disorder and concerned about insomnia.  Hospital admissions as well.  He is currently working at their bed as International aid/development worker patient does not have any kids  Patient gives a complicated history of seeing different psychiatrist providers last was [facility] I does not follows up with them for months.  He was also admitted to hospitalist 6 months ago for suicide attempt of overdose after finding out that fianc had a miscarriage says he did not because has been a psych unit but had a telepsych consultation while in the hospital.  Since then he has not followed up with outpatient practice as well says that he does not want to get back on all medications is that he was being overmedicated or felt like a zombie at times and he has been on different multiple medication altogether including 1 time on olanzapine, Latuda, Depakote.  He has been on Seroquel as well and Remeron  Patient felt that he was feeling numb and medication were kept on increasing. Patient has felt manic like according to him which means with increased energy decreased need for sleep mind is racing and risk-taking activities he has done most risk-taking activities in the past while being destructive and aware that he started using drugs.  He has used extensive amount of cocaine in the past till 2022 when he got admitted.  Since 2023 he has not been using cocaine.  He still uses  marijuana sporadically or once a week  No regular use of alcohol  Past use of extensive drugs including psychedelics, cocaine, marijuana or benzodiazepine and extensive use during his late teen years  Patient does not do psychotic symptoms delusions or hallucinations.  There was a time that he was having auditory hallucination no hearing voices in the past but nothing recently  Does not endorse excessive worries he feels his voice is regular not excessive he is good about his physical health and in general finances and relationship   He has been losing weight nearly 35 pounds or more in the last 5 to 6 months he is getting evaluated by primary care physician and got a referral for GI consultation.  There is Crohn's history in the family.  He feels that he has an appetite but after eating a small amount the just cannot add more food or does not have the desire.  He wants to be evaluated for insomnia considering he is having only 2 to 3 hours of sleep says that as he stops medication his insomnia has been more steady despite having 2 to 3 hours of sleep he still functions fairly well remains active and functional during the work.   He endorses that his manic episodes are getting more so  controlled in last few weeks,  he has not done any destructive behavior his main concern remains decrease sleep and mind racing during the daytime but no overall destructive behavior recently hopelessness or suicidal  thoughts  He states Seroquel is helping him sleep in the past and he has been on higher dose up to 800 mg but he was feeling like a zombie with the combination of medication  Also has been on Remeron that helps sleep and appetite is good In the past  Aggravating factors; work stress, decreased appetite relationship a few months ago  Modifying factors; current relationship , family   Duration since young age Hospital admission 2 times including 1 time for suicide attempt with  overdose   Hospital admission : 6 months ago , Fiance had miscarriage leading him to get depressed and suicide attempt with OD  2022 admission for depression    Past Psychiatric History: 2 admissions inpatient  Multiple psychiatrist changes or providers   Previous Psychotropic Medications: Yes  Olanzapine, hydroxyzine, depakote 1000, latuda 60mg    Substance Abuse History in the last 12 months:  Yes.    Consequences of Substance Abuse: Discussed effects of cocaine to depression, mania and judjement  Past Medical History:  Past Medical History:  Diagnosis Date   Anxiety    Depression     Past Surgical History:  Procedure Laterality Date   APPENDECTOMY  2017   HERNIA REPAIR     at age of 27 y.o umbilical   MYRINGOTOMY WITH TUBE PLACEMENT      Family Psychiatric History: mom : Aunt : bipolar   Family History:  Family History  Problem Relation Age of Onset   Depression Mother    Hyperlipidemia Father    Hypertension Father    Heart attack Paternal Grandfather     Social History:   Social History   Socioeconomic History   Marital status: Single    Spouse name: Not on file   Number of children: Not on file   Years of education: Not on file   Highest education level: Not on file  Occupational History   Occupation: [employer]  Tobacco Use   Smoking status: Every Day    Types: E-cigarettes   Smokeless tobacco: Never  Vaping Use   Vaping status: Every Day   Substances: Nicotine, Flavoring  Substance and Sexual Activity   Alcohol use: Not Currently   Drug use: Not Currently    Types: Marijuana   Sexual activity: Yes    Partners: Female    Comment: partner has an IUD  Other Topics Concern   Not on file  Social History Narrative   Not on file   Social Determinants of Health   Financial Resource Strain: Unknown (03/18/2020)   Received from [employer], [facility]   Overall Financial Resource Strain (CARDIA)    Difficulty of Paying Living  Expenses: Patient declined  Food Insecurity: No Food Insecurity (10/29/2020)   Received from [facility], [facility]   Hunger Vital Sign    Worried About Running Out of Food in the Last Year: Never true    Ran Out of Food in the Last Year: Never true  Transportation Needs: No Transportation Needs (03/18/2020)   Received from [employer], [facility]   PRAPARE - Transportation    Lack of Transportation (Medical): No    Lack of Transportation (Non-Medical): No  Physical Activity: Inactive (03/18/2020)   [facility], [facility]   Exercise Vital Sign    Days of Exercise per Week: 0 days    Minutes of Exercise per Session: 0 min  Stress: Unknown (03/18/2020)   [facility], [facility]  [employer] of Occupational Health - Occupational Stress Questionnaire    Feeling of Stress : Patient declined  Social Connections: Unknown (11/28/2021)   Received from [facility], [facility]   Social Network    Social Network: Not on file    Additional Social History: grew up with grand parents as his parents split at his age of 55 . No trauma or abuse   Allergies:  No Known Allergies  Metabolic Disorder Labs: Lab Results  Component Value Date   HGBA1C 5.1 01/11/2023   MPG 88.19 02/15/2020   MPG 88.19 03/21/2018   No results found for: "PROLACTIN" Lab Results  Component Value Date   CHOL 116 01/11/2023   TRIG 82 01/11/2023   HDL 39 (L) 01/11/2023   CHOLHDL 3.0 01/11/2023   VLDL 12 03/21/2018   LDLCALC 61 01/11/2023   LDLCALC 91 03/21/2018   Lab Results  Component Value Date   TSH 1.780 01/11/2023    Therapeutic Level Labs: Lab Results  Component Value Date   LITHIUM <0.06 (L) 03/21/2018   No results found for: "CBMZ" Lab Results  Component Value Date   VALPROATE 52 03/24/2018    Current Medications: Current Outpatient Medications  Medication Sig Dispense Refill   ondansetron (ZOFRAN) 4 MG tablet Take 1 tablet (4 mg  total) by mouth every 8 (eight) hours as needed for nausea or vomiting. 21 tablet 0   No current facility-administered medications for this visit.     Psychiatric Specialty Exam: Review of Systems  Cardiovascular:  Negative for chest pain.  Gastrointestinal:  Positive for nausea.  Neurological:  Negative for tremors.  Psychiatric/Behavioral:  Positive for sleep disturbance. Negative for agitation.     Blood pressure 118/78, pulse 78, height 6\' 1"  (1.854 m), weight 128 lb (58.1 kg).Body mass index is 16.89 kg/m.  General Appearance: Casual  Eye Contact:  Fair  Speech:  Clear and Coherent  Volume:  Decreased  Mood:  Euthymic  Affect:  Full Range  Thought Process:  Goal Directed  Orientation:  Full (Time, Place, and Person)  Thought Content:  Logical  Suicidal Thoughts:  No  Homicidal Thoughts:  No  Memory:  Immediate;   Fair  Judgement:  Fair  Insight:  Shallow  Psychomotor Activity:  Normal  Concentration:  Concentration: Fair  Recall:  Fair  Fund of Knowledge:Good  Language: Good  Akathisia:  No  Handed:    AIMS (if indicated):  not done  Assets:  Desire for Improvement  ADL's:  Intact  Cognition: WNL  Sleep:  Poor   Screenings: AIMS    Flowsheet Row [facility]Dis[facility]  AIMS Total Score 0      AUDIT    Flowsheet Row Admission (Discharged) [facility]  Alcohol Use Disorder Identification Test Final Score (AUDIT) 0      GAD-7    Flowsheet Row Office Visit from 03/11/2023 in [facility] at [facility] Office Visit from 01/11/2023 in [facility] at [facility] Visit from 12/03/2020 in [facility] at [facility]  Total GAD-7 Score 3 7 7       PHQ2-9    Flowsheet Row Office Visit from 03/11/2023 in [facility] at [facility] Office Visit from 01/11/2023 in [facility] Care at [facility] Visit from 12/03/2020 in  [place] [facility] at [facility]  PHQ-2 Total Score 0 2 3  PHQ-9 Total Score 4 11 13       Flowsheet Row Office Visit from 03/11/2023 in [facility] at [facility] ED from 09/24/2022 in [facility] ED from 11/05/2021 in [facility] at [facility] RISK CATEGORY No Risk No Risk No Risk       Assessment and Plan: as follows   Bipolar disorder with episodes of depression; we will start Seroquel 100 mg he can take half for the first 2 nights reviewed sleep hygiene to work on his bipolar component as well as depression and sleep reviewed side effects no tremors as of now he is to get evaluated by GI Dr. in regarding his nausea and decreased appetite as well  Insomnia syndrome reviewed sleep hygiene will start Seroquel for now.  If needed can refer to sleep clinic  Cocaine use disorder; remains sober since 1 year discussed relapse prevention and also effects of cocaine to sleep, manic episode and judgment including paranoia and hallucinations  He has been on different substance use in the past remains sober of them except for marijuana we also discussed to abstain from that although he is using it once a week   Recommend therapy to work on counseling to work on Pharmacologist considering 6 months ago admission for suicide attempt  We will also do gene testing and regarding to medication.  He has been on different medication in the past  Follow-up in 3 to 4 weeks or earlier if needed questions addressed education reviewed urgent needed 911 services are available in case of any decompensation as well   Direct care time spent 60 min including chart review, documentation and face to face  Collaboration of  Care: Primary Care Provider AEB notes referral reviewed  Patient/Guardian was advised Release of Information must be obtained prior to any record release in order to collaborate their care with an outside provider. Patient/Guardian was advised if they have not already done so to contact the registration department to sign all necessary forms in order for [place] to release information regarding their care.   Consent: Patient/Guardian gives verbal consent for treatment and assignment of benefits for services provided during this visit. Patient/Guardian expressed understanding and agreed to proceed.   Thresa Ross, MD 8/22/202411:10 AM

## 2023-03-15 ENCOUNTER — Ambulatory Visit (HOSPITAL_COMMUNITY): Payer: 59 | Admitting: Licensed Clinical Social Worker

## 2023-04-02 ENCOUNTER — Other Ambulatory Visit (HOSPITAL_COMMUNITY): Payer: Self-pay | Admitting: Psychiatry

## 2023-04-05 ENCOUNTER — Telehealth (HOSPITAL_COMMUNITY): Payer: Self-pay | Admitting: Psychiatry

## 2023-04-05 NOTE — Telephone Encounter (Signed)
Provider out of office for 04/06/2023 appointment. Called to reschedule. Left voicemail informing of cancellation and requesting call back.

## 2023-04-06 ENCOUNTER — Ambulatory Visit (HOSPITAL_COMMUNITY): Payer: 59 | Admitting: Psychiatry

## 2023-04-16 ENCOUNTER — Ambulatory Visit: Payer: Self-pay | Admitting: Gastroenterology

## 2023-05-12 ENCOUNTER — Other Ambulatory Visit (HOSPITAL_COMMUNITY): Payer: Self-pay | Admitting: Psychiatry

## 2024-03-02 ENCOUNTER — Other Ambulatory Visit: Payer: Self-pay | Admitting: Orthopaedic Surgery

## 2024-03-02 ENCOUNTER — Ambulatory Visit
Admission: RE | Admit: 2024-03-02 | Discharge: 2024-03-02 | Disposition: A | Discharge status: Home / Self Care | Source: Ambulatory Visit | Attending: Orthopaedic Surgery | Admitting: Orthopaedic Surgery

## 2024-03-02 DIAGNOSIS — S92012A Displaced fracture of body of left calcaneus, initial encounter for closed fracture: Secondary | ICD-10-CM

## 2024-03-04 NOTE — H&P (Signed)
 ORTHOPAEDIC SURGERY H&P  Subjective:  The patient presents for left calcaneus fracture.   Past Medical History:  Diagnosis Date   Anxiety    Depression     Past Surgical History:  Procedure Laterality Date   APPENDECTOMY  2017   HERNIA REPAIR     at age of 28 y.o umbilical   MYRINGOTOMY WITH TUBE PLACEMENT       (Not in an outpatient encounter)    No Known Allergies  Social History   Socioeconomic History   Marital status: Single    Spouse name: Not on file   Number of children: Not on file   Years of education: Not on file   Highest education level: Not on file  Occupational History   Occupation: [employer]  Tobacco Use   Smoking status: Every Day    Types: E-cigarettes   Smokeless tobacco: Never  Vaping Use   Vaping status: Every Day   Substances: Nicotine, Flavoring  Substance and Sexual Activity   Alcohol use: Not Currently   Drug use: Not Currently    Types: Marijuana   Sexual activity: Yes    Partners: Female    Comment: partner has an IUD  Other Topics Concern   Not on file  Social History Narrative   Not on file   Social Drivers of Health   Financial Resource Strain: Unknown (03/18/2020)   Received from [facility]   Overall Financial Resource Strain (CARDIA)    Difficulty of Paying Living Expenses: Patient declined  Food Insecurity: No Food Insecurity (10/29/2020)   Received [facility]   Hunger Vital Sign    Within the past 12 months, you worried that your food would run out before you got the money to buy more.: Never true    Within the past 12 months, the food you bought just didn't last and you didn't have money to get more.: Never true  Transportation Needs: No Transportation Needs (03/18/2020)   Received from [facility] - Transportation    Lack of Transportation (Medical): No    Lack of Transportation (Non-Medical): No  Physical Activity: Inactive (03/18/2020)   Received from [facility]   Exercise  Vital Sign    On average, how many days per week do you engage in moderate to strenuous exercise (like a brisk walk)?: 0 days    On average, how many minutes do you engage in exercise at this level?: 0 min  Stress: Unknown (03/18/2020)   Received from [facility] of Occupational Health - Occupational Stress Questionnaire    Feeling of Stress : Patient declined  Social Connections: Unknown (11/28/2021)   Received from [facility]   Social Network    Social Network: Not on file  Intimate Partner Violence: Not At Risk (07/05/2023)   Received from [facility]   HITS    Over the last 12 months how often did your partner physically hurt you?: Never    Over the last 12 months how often did your partner insult you or talk down to you?: Never    Over the last 12 months how often did your partner threaten you with physical harm?: Never    Over the last 12 months how often did your partner scream or curse at you?: Never     History reviewed. No pertinent family history.   Review of Systems Pertinent items are noted in HPI.  Objective: Vital signs in last 24 hours:  03/11/2023   11:00 AM 01/25/2023    1:25 PM 01/11/2023    3:44 PM  Vitals with BMI  Height 6' 1 6' 0 6' 0  Weight 128 lbs 136 lbs 13 oz --  BMI 16.89 18.55   Systolic 118 122 857  Diastolic 78 78 89  Pulse 78 82 74      EXAM: General: Well nourished, well developed. Awake, alert and oriented to time, place, person. Normal mood and affect. No apparent distress. Breathing room air.  Operative Lower Extremity: Alignment - Neutral Deformity - None Skin intact Tenderness to palpation - left calcaneus 5/5 TA, PT, GS, Per, EHL, FHL Sensation intact to light touch throughout Palpable DP and PT pulses Special testing: None  The contralateral foot/ankle was examined for comparison and noted to be neurovascularly intact with no localized deformity, swelling, or tenderness.  Imaging Review All  images taken were independently reviewed by me.  Assessment/Plan: The clinical and radiographic findings were reviewed and discussed at length with the patient.  The patient presents for left calcaneus fracture.  We spoke at length about the natural course of these findings. We discussed nonoperative and operative treatment options in detail.  The risks and benefits were presented and reviewed. The risks due to suture/hardware failure/irritation (or if removing hardware inability to remove part/all of hardware, recurrent instability), new/persistent/recurrent infection, stiffness, nerve/vessel/tendon injury, nonunion/malunion of any fracture, wound healing issues, allograft usage, development of arthritis, failure of this surgery, possibility of external fixation in certain situations, possibility of delayed definitive surgery, need for further surgery, prolonged wound care including further soft tissue coverage procedures, thromboembolic events, anesthesia/medical complications/events perioperatively and beyond, amputation, death among others were discussed. The patient acknowledged the explanation and agreed to proceed with the plan.  Lillia Mountain  Orthopaedic Surgery [facility]

## 2024-03-04 NOTE — Discharge Instructions (Signed)
 Lillia Mountain, MD [facility]  Please read the following information regarding your care after surgery.  Medications  You only need a prescription for the narcotic pain medicine (ex. oxycodone , Percocet, Norco).  All of the other medicines listed below are available over the counter. ? acetaminophen  (Tylenol ) 500 mg every 4-6 hours as you need for minor to moderate pain  ? To help prevent blood clots, take aspirin (81 mg) twice daily for at least 42 days after surgery.  You should also get up every hour while you are awake to move around.  Weight Bearing ? Do NOT bear any weight on the operated leg or foot. This means do NOT touch your surgical leg to the ground!  Cast / Splint / Dressing ? If you have a splint, do NOT remove this. Keep your splint, cast or dressing clean and dry.  Don't put anything (coat hanger, pencil, etc) down inside of it.  If it gets wet, call the office immediately to schedule an appointment for a cast change.  Swelling IMPORTANT: It is normal for you to have swelling where you had surgery. To reduce swelling and pain, keep at least 3 pillows under your leg so that your toes are above your nose and your heel is above the level of your hip.  It may be necessary to keep your foot or leg elevated for several weeks.  This is critical to helping your incisions heal and your pain to feel better.  Follow Up Call my office at [phone] when you are discharged from the hospital or surgery center to schedule an appointment to be seen 7-10 days after surgery.  Call my office at [phone] if you develop a fever >101.5 F, nausea, vomiting, bleeding from the surgical site or severe pain.     Post Anesthesia Home Care Instructions  Activity: Get plenty of rest for the remainder of the day. A responsible individual must stay with you for 24 hours following the procedure.  For the next 24 hours, DO NOT: -Drive a car -Advertising copywriter -Drink alcoholic  beverages -Take any medication unless instructed by your physician -Make any legal decisions or sign important papers.  Meals: Start with liquid foods such as gelatin or soup. Progress to regular foods as tolerated. Avoid greasy, spicy, heavy foods. If nausea and/or vomiting occur, drink only clear liquids until the nausea and/or vomiting subsides. Call your physician if vomiting continues.  Special Instructions/Symptoms: Your throat may feel dry or sore from the anesthesia or the breathing tube placed in your throat during surgery. If this causes discomfort, gargle with warm salt water. The discomfort should disappear within 24 hours.  If you had a scopolamine patch placed behind your ear for the management of post- operative nausea and/or vomiting:  1. The medication in the patch is effective for 72 hours, after which it should be removed.  Wrap patch in a tissue and discard in the trash. Wash hands thoroughly with soap and water. 2. You may remove the patch earlier than 72 hours if you experience unpleasant side effects which may include dry mouth, dizziness or visual disturbances. 3. Avoid touching the patch. Wash your hands with soap and water after contact with the patch.    Regional Anesthesia Blocks  1. You may not be able to move or feel the blocked extremity after a regional anesthetic block. This may last may last from 3-48 hours after placement, but it will go away. The length of time depends on the medication injected  and your individual response to the medication. As the nerves start to wake up, you may experience tingling as the movement and feeling returns to your extremity. If the numbness and inability to move your extremity has not gone away after 48 hours, please call your surgeon.   2. The extremity that is blocked will need to be protected until the numbness is gone and the strength has returned. Because you cannot feel it, you will need to take extra care to avoid injury.  Because it may be weak, you may have difficulty moving it or using it. You may not know what position it is in without looking at it while the block is in effect.  3. For blocks in the legs and feet, returning to weight bearing and walking needs to be done carefully. You will need to wait until the numbness is entirely gone and the strength has returned. You should be able to move your leg and foot normally before you try and bear weight or walk. You will need someone to be with you when you first try to ensure you do not fall and possibly risk injury.  4. Bruising and tenderness at the needle site are common side effects and will resolve in a few days.  5. Persistent numbness or new problems with movement should be communicated to the surgeon or the [facility] [phone] [facility] [phone]).  *May have Tylenol  today at 3pm*

## 2024-03-06 ENCOUNTER — Other Ambulatory Visit: Payer: Self-pay

## 2024-03-06 ENCOUNTER — Encounter (HOSPITAL_BASED_OUTPATIENT_CLINIC_OR_DEPARTMENT_OTHER): Payer: Self-pay | Admitting: Orthopaedic Surgery

## 2024-03-10 ENCOUNTER — Ambulatory Visit (HOSPITAL_BASED_OUTPATIENT_CLINIC_OR_DEPARTMENT_OTHER): Admitting: Anesthesiology

## 2024-03-10 ENCOUNTER — Ambulatory Visit (HOSPITAL_BASED_OUTPATIENT_CLINIC_OR_DEPARTMENT_OTHER)
Admission: RE | Admit: 2024-03-10 | Discharge: 2024-03-10 | Disposition: A | Discharge status: Home / Self Care | Attending: Orthopaedic Surgery | Admitting: Orthopaedic Surgery

## 2024-03-10 ENCOUNTER — Encounter (HOSPITAL_BASED_OUTPATIENT_CLINIC_OR_DEPARTMENT_OTHER)
Admission: RE | Disposition: A | Discharge status: Home / Self Care | Payer: Self-pay | Source: Home / Self Care | Attending: Orthopaedic Surgery

## 2024-03-10 ENCOUNTER — Other Ambulatory Visit: Payer: Self-pay

## 2024-03-10 ENCOUNTER — Encounter (HOSPITAL_BASED_OUTPATIENT_CLINIC_OR_DEPARTMENT_OTHER): Payer: Self-pay | Admitting: Orthopaedic Surgery

## 2024-03-10 ENCOUNTER — Ambulatory Visit (HOSPITAL_COMMUNITY)

## 2024-03-10 DIAGNOSIS — F419 Anxiety disorder, unspecified: Secondary | ICD-10-CM | POA: Diagnosis not present

## 2024-03-10 DIAGNOSIS — X58XXXA Exposure to other specified factors, initial encounter: Secondary | ICD-10-CM | POA: Insufficient documentation

## 2024-03-10 DIAGNOSIS — F319 Bipolar disorder, unspecified: Secondary | ICD-10-CM | POA: Insufficient documentation

## 2024-03-10 DIAGNOSIS — Z87891 Personal history of nicotine dependence: Secondary | ICD-10-CM | POA: Insufficient documentation

## 2024-03-10 DIAGNOSIS — S92002A Unspecified fracture of left calcaneus, initial encounter for closed fracture: Secondary | ICD-10-CM | POA: Insufficient documentation

## 2024-03-10 HISTORY — PX: ORIF CALCANEOUS FRACTURE: SHX5030

## 2024-03-10 SURGERY — OPEN REDUCTION INTERNAL FIXATION (ORIF) CALCANEOUS FRACTURE
Anesthesia: Regional | Site: Foot | Laterality: Left

## 2024-03-10 MED ORDER — MIDAZOLAM HCL 2 MG/2ML IJ SOLN
2.0000 mg | Freq: Once | INTRAMUSCULAR | Status: AC
  Administered 2024-03-10: 2 mg via INTRAVENOUS

## 2024-03-10 MED ORDER — MIDAZOLAM HCL 2 MG/2ML IJ SOLN
INTRAMUSCULAR | Status: AC
  Filled 2024-03-10: qty 2

## 2024-03-10 MED ORDER — OXYCODONE HCL 5 MG/5ML PO SOLN: 5.0000 mg | Freq: Once | ORAL | Status: DC | PRN

## 2024-03-10 MED ORDER — DEXAMETHASONE SODIUM PHOSPHATE 10 MG/ML IJ SOLN
INTRAMUSCULAR | Status: AC
  Filled 2024-03-10: qty 1

## 2024-03-10 MED ORDER — PROPOFOL 10 MG/ML IV BOLUS
INTRAVENOUS | Status: AC
Start: 2024-03-10 — End: 2024-03-10
  Filled 2024-03-10: qty 20

## 2024-03-10 MED ORDER — 0.9 % SODIUM CHLORIDE (POUR BTL) OPTIME
TOPICAL | Status: DC | PRN
Start: 2024-03-10 — End: 2024-03-10
  Administered 2024-03-10: 200 mL

## 2024-03-10 MED ORDER — LIDOCAINE 2% (20 MG/ML) 5 ML SYRINGE
INTRAMUSCULAR | Status: DC | PRN
Start: 2024-03-10 — End: 2024-03-10
  Administered 2024-03-10: 60 mg via INTRAVENOUS

## 2024-03-10 MED ORDER — CHLORHEXIDINE GLUCONATE 4 % EX SOLN: 60.0000 mL | Freq: Once | CUTANEOUS | Status: DC

## 2024-03-10 MED ORDER — ONDANSETRON HCL 4 MG/2ML IJ SOLN
INTRAMUSCULAR | Status: AC
  Filled 2024-03-10: qty 2

## 2024-03-10 MED ORDER — CEFAZOLIN SODIUM-DEXTROSE 2-4 GM/100ML-% IV SOLN
INTRAVENOUS | Status: AC
Start: 2024-03-10 — End: 2024-03-10
  Filled 2024-03-10: qty 100

## 2024-03-10 MED ORDER — HYDROMORPHONE HCL 1 MG/ML IJ SOLN
INTRAMUSCULAR | Status: AC
Start: 2024-03-10 — End: 2024-03-10
  Filled 2024-03-10: qty 0.5

## 2024-03-10 MED ORDER — POVIDONE-IODINE 10 % EX SOLN
CUTANEOUS | Status: DC | PRN
  Administered 2024-03-10: 1 via TOPICAL

## 2024-03-10 MED ORDER — CEFAZOLIN SODIUM-DEXTROSE 2-4 GM/100ML-% IV SOLN
2.0000 g | INTRAVENOUS | Status: AC
  Administered 2024-03-10: 2 g via INTRAVENOUS

## 2024-03-10 MED ORDER — FENTANYL CITRATE (PF) 100 MCG/2ML IJ SOLN
INTRAMUSCULAR | Status: AC
Start: 2024-03-10 — End: 2024-03-10
  Filled 2024-03-10: qty 2

## 2024-03-10 MED ORDER — LIDOCAINE 2% (20 MG/ML) 5 ML SYRINGE
INTRAMUSCULAR | Status: AC
  Filled 2024-03-10: qty 5

## 2024-03-10 MED ORDER — ONDANSETRON HCL 4 MG/2ML IJ SOLN
INTRAMUSCULAR | Status: DC | PRN
  Administered 2024-03-10: 4 mg via INTRAVENOUS

## 2024-03-10 MED ORDER — ACETAMINOPHEN 500 MG PO TABS
1000.0000 mg | ORAL_TABLET | Freq: Once | ORAL | Status: AC
  Administered 2024-03-10: 1000 mg via ORAL

## 2024-03-10 MED ORDER — AMISULPRIDE (ANTIEMETIC) 5 MG/2ML IV SOLN: 10.0000 mg | Freq: Once | INTRAVENOUS | Status: DC | PRN

## 2024-03-10 MED ORDER — DEXAMETHASONE SODIUM PHOSPHATE 4 MG/ML IJ SOLN
INTRAMUSCULAR | Status: DC | PRN
  Administered 2024-03-10: 5 mg via INTRAVENOUS

## 2024-03-10 MED ORDER — BUPIVACAINE-EPINEPHRINE (PF) 0.5% -1:200000 IJ SOLN
INTRAMUSCULAR | Status: DC | PRN
  Administered 2024-03-10: 30 mL via PERINEURAL

## 2024-03-10 MED ORDER — FENTANYL CITRATE (PF) 100 MCG/2ML IJ SOLN: 25.0000 ug | INTRAMUSCULAR | Status: DC | PRN

## 2024-03-10 MED ORDER — PROPOFOL 10 MG/ML IV BOLUS
INTRAVENOUS | Status: DC | PRN
  Administered 2024-03-10: 100 mg via INTRAVENOUS
  Administered 2024-03-10: 200 mg via INTRAVENOUS

## 2024-03-10 MED ORDER — FENTANYL CITRATE (PF) 100 MCG/2ML IJ SOLN
100.0000 ug | Freq: Once | INTRAMUSCULAR | Status: AC
  Administered 2024-03-10: 100 ug via INTRAVENOUS

## 2024-03-10 MED ORDER — ACETAMINOPHEN 500 MG PO TABS
ORAL_TABLET | ORAL | Status: AC
  Filled 2024-03-10: qty 2

## 2024-03-10 MED ORDER — OXYCODONE HCL 5 MG PO TABS: 5.0000 mg | ORAL_TABLET | Freq: Once | ORAL | Status: DC | PRN

## 2024-03-10 MED ORDER — FENTANYL CITRATE (PF) 100 MCG/2ML IJ SOLN
INTRAMUSCULAR | Status: DC | PRN
  Administered 2024-03-10 (×2): 50 ug via INTRAVENOUS

## 2024-03-10 MED ORDER — LACTATED RINGERS IV SOLN: INTRAVENOUS | Status: DC

## 2024-03-10 MED ORDER — FENTANYL CITRATE (PF) 100 MCG/2ML IJ SOLN
INTRAMUSCULAR | Status: AC
  Filled 2024-03-10: qty 2

## 2024-03-10 MED ORDER — VANCOMYCIN HCL 500 MG IV SOLR
INTRAVENOUS | Status: AC
  Filled 2024-03-10: qty 10

## 2024-03-10 MED ORDER — MIDAZOLAM HCL 5 MG/5ML IJ SOLN
INTRAMUSCULAR | Status: DC | PRN
Start: 2024-03-10 — End: 2024-03-10
  Administered 2024-03-10: 2 mg via INTRAVENOUS

## 2024-03-10 SURGICAL SUPPLY — 57 items
BIT DRILL 4.8X200 CANN (BIT) IMPLANT
BLADE SURG 15 STRL LF DISP TIS (BLADE) ×4 IMPLANT
BNDG COHESIVE 4X5 TAN STRL LF (GAUZE/BANDAGES/DRESSINGS) ×1 IMPLANT
BNDG COMPR ESMARK 6X3 LF (GAUZE/BANDAGES/DRESSINGS) ×1 IMPLANT
BNDG ELASTIC 4INX 5YD STR LF (GAUZE/BANDAGES/DRESSINGS) IMPLANT
BNDG ELASTIC 6INX 5YD STR LF (GAUZE/BANDAGES/DRESSINGS) IMPLANT
BNDG ELASTIC 6X10 VLCR STRL LF (GAUZE/BANDAGES/DRESSINGS) ×1 IMPLANT
BNDG GAUZE DERMACEA FLUFF 4 (GAUZE/BANDAGES/DRESSINGS) ×1 IMPLANT
BRUSH SCRUB EZ 4% CHG (MISCELLANEOUS) ×1 IMPLANT
CANISTER SUCT 1200ML W/VALVE (MISCELLANEOUS) ×1 IMPLANT
CHLORAPREP W/TINT 26 (MISCELLANEOUS) ×1 IMPLANT
COVER BACK TABLE 60X90IN (DRAPES) ×1 IMPLANT
CUFF TRNQT CYL 34X4.125X (TOURNIQUET CUFF) ×1 IMPLANT
DRAPE C-ARM 42X72 X-RAY (DRAPES) ×1 IMPLANT
DRAPE C-ARMOR (DRAPES) ×1 IMPLANT
DRAPE EXTREMITY T 121X128X90 (DISPOSABLE) ×1 IMPLANT
DRAPE IMP U-DRAPE 54X76 (DRAPES) ×1 IMPLANT
DRAPE U-SHAPE 47X51 STRL (DRAPES) ×1 IMPLANT
DRSG MEPITEL 4X7.2 (GAUZE/BANDAGES/DRESSINGS) ×1 IMPLANT
ELECTRODE REM PT RTRN 9FT ADLT (ELECTROSURGICAL) ×1 IMPLANT
GAUZE PAD ABD 8X10 STRL (GAUZE/BANDAGES/DRESSINGS) ×3 IMPLANT
GAUZE SPONGE 4X4 12PLY STRL (GAUZE/BANDAGES/DRESSINGS) ×1 IMPLANT
GLOVE BIOGEL PI IND STRL 8 (GLOVE) ×1 IMPLANT
GLOVE SURG SS PI 7.5 STRL IVOR (GLOVE) ×2 IMPLANT
GOWN STRL REUS W/ TWL LRG LVL3 (GOWN DISPOSABLE) ×2 IMPLANT
MARKER SKIN DUAL TIP RULER LAB (MISCELLANEOUS) IMPLANT
NDL HYPO 25X1 1.5 SAFETY (NEEDLE) IMPLANT
NEEDLE HYPO 25X1 1.5 SAFETY (NEEDLE) IMPLANT
NS IRRIG 1000ML POUR BTL (IV SOLUTION) ×1 IMPLANT
PACK BASIN DAY SURGERY FS (CUSTOM PROCEDURE TRAY) ×1 IMPLANT
PADDING CAST ABS COTTON 4X4 ST (CAST SUPPLIES) IMPLANT
PADDING CAST SYNTHETIC 4X4 STR (CAST SUPPLIES) ×4 IMPLANT
PADDING CAST SYNTHETIC 6X4 NS (CAST SUPPLIES) ×2 IMPLANT
PENCIL SMOKE EVACUATOR (MISCELLANEOUS) ×1 IMPLANT
PIN GUIDE DRIL TIP 2.8X300 STE (PIN) IMPLANT
SCREW CANNULATED 6.5X50 (Screw) IMPLANT
SCREW CANNULATED 6.5X55 KNEE (Screw) IMPLANT
SHEET MEDIUM DRAPE 40X70 STRL (DRAPES) ×1 IMPLANT
SLEEVE SCD COMPRESS KNEE MED (STOCKING) ×1 IMPLANT
SPIKE FLUID TRANSFER (MISCELLANEOUS) IMPLANT
SPLINT FIBERGLASS 4X30 (CAST SUPPLIES) ×1 IMPLANT
SPLINT PLASTER CAST FAST 5X30 (CAST SUPPLIES) ×20 IMPLANT
SPONGE T-LAP 18X18 ~~LOC~~+RFID (SPONGE) ×1 IMPLANT
STAPLER SKIN PROX WIDE 3.9 (STAPLE) IMPLANT
STOCKINETTE ORTHO 6X25 (MISCELLANEOUS) ×1 IMPLANT
SUCTION TUBE FRAZIER 10FR DISP (SUCTIONS) IMPLANT
SUT ETHILON 2 0 FS 18 (SUTURE) ×2 IMPLANT
SUT MNCRL AB 3-0 PS2 18 (SUTURE) IMPLANT
SUT PDS II 3-0 CT2 27 ABS (SUTURE) IMPLANT
SUT VIC AB 2-0 SH 27XBRD (SUTURE) ×1 IMPLANT
SUT VIC AB 3-0 SH 27X BRD (SUTURE) IMPLANT
SUT VICRYL 0 SH 27 (SUTURE) IMPLANT
SYR BULB IRRIG 60ML STRL (SYRINGE) ×1 IMPLANT
SYR CONTROL 10ML LL (SYRINGE) IMPLANT
TOWEL GREEN STERILE FF (TOWEL DISPOSABLE) ×2 IMPLANT
TUBE CONNECTING 20X1/4 (TUBING) ×1 IMPLANT
UNDERPAD 30X36 HEAVY ABSORB (UNDERPADS AND DIAPERS) ×1 IMPLANT

## 2024-03-10 NOTE — H&P (Signed)

## 2024-03-10 NOTE — Anesthesia Procedure Notes (Signed)
 Procedure Name: LMA Insertion Date/Time: 03/10/2024 11:27 AM  Performed by: Pam Macario BROCKS, CRNAPre-anesthesia Checklist: Patient identified, Emergency Drugs available, Suction available, Patient being monitored and Timeout performed Patient Re-evaluated:Patient Re-evaluated prior to induction Oxygen Delivery Method: Circle system utilized Preoxygenation: Pre-oxygenation with 100% oxygen Induction Type: IV induction Ventilation: Mask ventilation without difficulty LMA: LMA inserted LMA Size: 4.0 Number of attempts: 1 Airway Equipment and Method: Bite block Placement Confirmation: positive ETCO2, breath sounds checked- equal and bilateral and CO2 detector Tube secured with: Tape Dental Injury: Teeth and Oropharynx as per pre-operative assessment

## 2024-03-10 NOTE — Anesthesia Postprocedure Evaluation (Signed)
 Anesthesia Post Note  Patient: Ronald Stanley  Procedure(s) Performed: OPEN REDUCTION INTERNAL FIXATION (ORIF) CALCANEOUS FRACTURE (Left: Foot)     Patient location during evaluation: PACU Anesthesia Type: Regional and General Level of consciousness: awake Pain management: pain level controlled Vital Signs Assessment: post-procedure vital signs reviewed and stable Respiratory status: spontaneous breathing, nonlabored ventilation and respiratory function stable Cardiovascular status: blood pressure returned to baseline and stable Postop Assessment: no apparent nausea or vomiting Anesthetic complications: no   No notable events documented.  Last Vitals:  Vitals:   03/10/24 1245 03/10/24 1304  BP:  97/79  Pulse: 68 67  Resp: 20 16  Temp:  36.7 C  SpO2: 96% 97%    Last Pain:  Vitals:   03/10/24 1304  TempSrc: Temporal  PainSc: 0-No pain                 Monea Pesantez P Jatniel Verastegui

## 2024-03-10 NOTE — Transfer of Care (Signed)
 Immediate Anesthesia Transfer of Care Note  Patient: Ronald Stanley  Procedure(s) Performed: OPEN REDUCTION INTERNAL FIXATION (ORIF) CALCANEOUS FRACTURE (Left: Foot)  Patient Location: PACU  Anesthesia Type:General  Level of Consciousness: sedated and drowsy  Airway & Oxygen Therapy: Patient Spontanous Breathing and Patient connected to nasal cannula oxygen  Post-op Assessment: Report given to RN  Post vital signs: Reviewed and stable  Last Vitals:  Vitals Value Taken Time  BP 95/56 03/10/24 12:16  Temp    Pulse 58 03/10/24 12:17  Resp 7 03/10/24 12:17  SpO2 97 % 03/10/24 12:17  Vitals shown include unfiled device data.  Last Pain:  Vitals:   03/10/24 0844  TempSrc: Oral  PainSc: 8       Patients Stated Pain Goal: 4 (03/10/24 0844)  Complications: No notable events documented.

## 2024-03-10 NOTE — Progress Notes (Signed)
Assisted Dr. Ellender with left, popliteal, ultrasound guided block. Side rails up, monitors on throughout procedure. See vital signs in flow sheet. Tolerated Procedure well. 

## 2024-03-10 NOTE — Anesthesia Procedure Notes (Signed)
 Anesthesia Regional Block: Popliteal block   Pre-Anesthetic Checklist: , timeout performed,  Correct Patient, Correct Site, Correct Laterality,  Correct Procedure, Correct Position, site marked,  Risks and benefits discussed,  Surgical consent,  Pre-op evaluation,  At surgeon's request and post-op pain management  Laterality: Left  Prep: chloraprep       Needles:  Injection technique: Single-shot  Needle Type: Echogenic Stimulator Needle     Needle Length: 10cm  Needle Gauge: 20     Additional Needles:   Procedures:,,,, ultrasound used (permanent image in chart),,    Narrative:  Start time: 03/10/2024 9:00 AM End time: 03/10/2024 9:10 AM Injection made incrementally with aspirations every 5 mL.  Performed by: Personally  Anesthesiologist: Patrisha Bernardino SQUIBB, MD  Additional Notes: Functioning IV was confirmed and monitors were applied.  A timeout was performed. Sterile prep, hand hygiene and sterile gloves were used. A 20ga Bbraun echogenic stimulator needle was used. Negative aspiration and negative test dose prior to incremental administration of local anesthetic. The patient tolerated the procedure well.  Ultrasound guidance: relevent anatomy identified, needle position confirmed, local anesthetic spread visualized around nerve(s), vascular puncture avoided.  Image printed for medical record.

## 2024-03-10 NOTE — Op Note (Signed)
 03/10/2024  8:20 PM   PATIENT: Ronald Stanley  28 y.o. male  MRN: [id number]   PRE-OPERATIVE DIAGNOSIS:   Closed nondisplaced fracture of left calcaneus, unspecified portion of calcaneus, initial encounter   POST-OPERATIVE DIAGNOSIS:   Closed nondisplaced fracture of left calcaneus, unspecified portion of calcaneus, initial encounter   PROCEDURE: Left calcaneus open reduction internal fixation   SURGEON:  Lillia Mountain, MD   ASSISTANT: None   ANESTHESIA: General, regional   EBL: Minimal   TOURNIQUET:    Total Tourniquet Time Documented: Thigh (Left) - 13 minutes Total: Thigh (Left) - 13 minutes    COMPLICATIONS: None apparent   DISPOSITION: Extubated, awake and stable to recovery.   INDICATION FOR PROCEDURE: The patient presented with above diagnosis.  We discussed the diagnosis, alternative treatment options, risks and benefits of the above surgical intervention, as well as alternative non-operative treatments. All questions/concerns were addressed and the patient/family demonstrated appropriate understanding of the diagnosis, the procedure, the postoperative course, and overall prognosis. The patient wished to proceed with surgical intervention and signed an informed surgical consent as such, in each others presence prior to surgery.   PROCEDURE IN DETAIL: After preoperative consent was obtained and the correct operative site was identified, the patient was brought to the operating room supine on stretcher and transferred onto operating table. General anesthesia was induced. Preoperative antibiotics were administered. Surgical timeout was taken. The patient was then positioned supine with an ipsilateral hip bump. The operative lower extremity was prepped and draped in standard sterile fashion with a tourniquet around the thigh. The extremity was exsanguinated and the tourniquet was inflated to 275 mmHg.  We began by making an incision over the posterior  portion of the calcaneus fracture. Direct reduction was performed as verified by intraoperative fluoroscopy. We then provisionally fixed this reduction using a large Kirschner wire. This was overdrilled and a 6.5 mm Biomet partially threaded lag screw was implanted.  The surgical sites were thoroughly irrigated. The tourniquet was deflated and hemostasis achieved. Betadine  was applied. The deep layers were closed using 2-0 vicryl. The skin was closed without tension.    The leg was cleaned with saline and sterile dressings with gauze were applied. A well padded bulky short leg splint was applied. The patient was awakened from anesthesia and transported to the recovery room in stable condition.    FOLLOW UP PLAN: -transfer to PACU, then home -strict NWB operative extremity, maximum elevation -maintain short leg splint until follow up -DVT ppx: Aspirin 81 mg twice daily while NWB -follow up as outpatient within 7-10 days for wound check with exchange of short leg splint to short leg cast -sutures out in 2-3 weeks in outpatient office   RADIOGRAPHS: AP, lateral, oblique radiographs of the left foot and ankle were obtained intraoperatively. These showed interval reduction and fixation of the fractures. All the joints were noted to be stable following fixation. All hardware is appropriately positioned and of the appropriate lengths. No other acute injuries are noted.   Lillia Mountain [facility]

## 2024-03-10 NOTE — Anesthesia Preprocedure Evaluation (Addendum)
 Anesthesia Evaluation  Patient identified by MRN, date of birth, ID band Patient awake    Reviewed: Allergy & Precautions, NPO status , Patient's Chart, lab work & pertinent test results  Airway Mallampati: II  TM Distance: >3 FB Neck ROM: Full    Dental no notable dental hx.    Pulmonary former smoker Vapes   Pulmonary exam normal        Cardiovascular negative cardio ROS Normal cardiovascular exam     Neuro/Psych  PSYCHIATRIC DISORDERS Anxiety Depression Bipolar Disorder   negative neurological ROS     GI/Hepatic negative GI ROS, Neg liver ROS,,,  Endo/Other  negative endocrine ROS    Renal/GU negative Renal ROS     Musculoskeletal   Abdominal   Peds  Hematology negative hematology ROS (+)   Anesthesia Other Findings Closed nondisplaced fracture of left calcaneus, unspecified portion of calcaneus, initial encounter  Reproductive/Obstetrics                              Anesthesia Physical Anesthesia Plan  ASA: 2  Anesthesia Plan: Regional and General   Post-op Pain Management: Regional block*   Induction: Intravenous  PONV Risk Score and Plan: 2 and Ondansetron , Dexamethasone , Midazolam  and Treatment may vary due to age or medical condition  Airway Management Planned: LMA  Additional Equipment:   Intra-op Plan:   Post-operative Plan: Extubation in OR  Informed Consent: I have reviewed the patients History and Physical, chart, labs and discussed the procedure including the risks, benefits and alternatives for the proposed anesthesia with the patient or authorized representative who has indicated his/her understanding and acceptance.     Dental advisory given  Plan Discussed with: CRNA  Anesthesia Plan Comments:          Anesthesia Quick Evaluation

## 2024-03-13 ENCOUNTER — Encounter (HOSPITAL_BASED_OUTPATIENT_CLINIC_OR_DEPARTMENT_OTHER): Payer: Self-pay | Admitting: Orthopaedic Surgery
